# Patient Record
Sex: Female | Born: 1940 | Race: White | Hispanic: No | Smoking: Never smoker
Health system: Southern US, Community
[De-identification: ages and names within clinical notes are randomized; demographics above are authoritative.]

## PROBLEM LIST (undated history)

## (undated) DIAGNOSIS — F32A Depression, unspecified: Secondary | ICD-10-CM

## (undated) DIAGNOSIS — F419 Anxiety disorder, unspecified: Secondary | ICD-10-CM

## (undated) DIAGNOSIS — E7800 Pure hypercholesterolemia, unspecified: Secondary | ICD-10-CM

## (undated) HISTORY — PX: ABDOMINAL HYSTERECTOMY: SHX81

## (undated) HISTORY — PX: JOINT REPLACEMENT: SHX530

---

## 2021-11-13 ENCOUNTER — Emergency Department
Admission: EM | Admit: 2021-11-13 | Discharge: 2021-11-13 | Disposition: A | Discharge status: Home / Self Care | Payer: Medicare (Managed Care) | Attending: Emergency Medicine | Admitting: Emergency Medicine

## 2021-11-13 ENCOUNTER — Emergency Department: Payer: Medicare (Managed Care)

## 2021-11-13 ENCOUNTER — Other Ambulatory Visit: Payer: Self-pay

## 2021-11-13 ENCOUNTER — Encounter: Payer: Self-pay | Admitting: Intensive Care

## 2021-11-13 DIAGNOSIS — R197 Diarrhea, unspecified: Secondary | ICD-10-CM | POA: Insufficient documentation

## 2021-11-13 DIAGNOSIS — R55 Syncope and collapse: Secondary | ICD-10-CM | POA: Diagnosis not present

## 2021-11-13 DIAGNOSIS — R8289 Other abnormal findings on cytological and histological examination of urine: Secondary | ICD-10-CM | POA: Insufficient documentation

## 2021-11-13 DIAGNOSIS — R1030 Lower abdominal pain, unspecified: Secondary | ICD-10-CM | POA: Insufficient documentation

## 2021-11-13 DIAGNOSIS — R112 Nausea with vomiting, unspecified: Secondary | ICD-10-CM | POA: Diagnosis not present

## 2021-11-13 DIAGNOSIS — E860 Dehydration: Secondary | ICD-10-CM | POA: Insufficient documentation

## 2021-11-13 HISTORY — DX: Pure hypercholesterolemia, unspecified: E78.00

## 2021-11-13 HISTORY — DX: Anxiety disorder, unspecified: F41.9

## 2021-11-13 HISTORY — DX: Depression, unspecified: F32.A

## 2021-11-13 LAB — BASIC METABOLIC PANEL
Anion gap: 8 (ref 5–15)
BUN: 11 mg/dL (ref 8–23)
CO2: 29 mmol/L (ref 22–32)
Calcium: 9.4 mg/dL (ref 8.9–10.3)
Chloride: 104 mmol/L (ref 98–111)
Creatinine, Ser: 0.65 mg/dL (ref 0.44–1.00)
GFR, Estimated: >60 mL/min (ref >60)
Glucose, Bld: 104 mg/dL — ABNORMAL HIGH (ref 70–99)
Potassium: 4.2 mmol/L (ref 3.5–5.1)
Sodium: 141 mmol/L (ref 135–145)

## 2021-11-13 LAB — URINALYSIS, ROUTINE W REFLEX MICROSCOPIC
Bacteria, UA: NONE SEEN
Bilirubin Urine: NEGATIVE
Glucose, UA: NEGATIVE mg/dL
Ketones, ur: NEGATIVE mg/dL
Nitrite: NEGATIVE
Protein, ur: NEGATIVE mg/dL
Specific Gravity, Urine: 1.005 (ref 1.005–1.030)
pH: 7 (ref 5.0–8.0)

## 2021-11-13 LAB — CBC
HCT: 40.6 % (ref 36.0–46.0)
Hemoglobin: 13.1 g/dL (ref 12.0–15.0)
MCH: 31.8 pg (ref 26.0–34.0)
MCHC: 32.3 g/dL (ref 30.0–36.0)
MCV: 98.5 fL (ref 80.0–100.0)
Platelets: 202 10*3/uL (ref 150–400)
RBC: 4.12 MIL/uL (ref 3.87–5.11)
RDW: 13.1 % (ref 11.5–15.5)
WBC: 5.3 10*3/uL (ref 4.0–10.5)
nRBC: 0 % (ref 0.0–0.2)

## 2021-11-13 MED ORDER — IOHEXOL 300 MG/ML  SOLN
100.0000 mL | Freq: Once | INTRAMUSCULAR | Status: AC | PRN
  Administered 2021-11-13: 100 mL via INTRAVENOUS

## 2021-11-13 MED ORDER — DICYCLOMINE HCL 10 MG PO CAPS: 10.0000 mg | ORAL_CAPSULE | Freq: Three times a day (TID) | ORAL | 0 refills | Status: DC | PRN

## 2021-11-13 MED ORDER — ONDANSETRON 4 MG PO TBDP: 4.0000 mg | ORAL_TABLET | Freq: Three times a day (TID) | ORAL | 0 refills | Status: DC | PRN

## 2021-11-13 MED ORDER — CEPHALEXIN 500 MG PO CAPS: 500.0000 mg | ORAL_CAPSULE | Freq: Two times a day (BID) | ORAL | 0 refills | Status: AC

## 2021-11-13 MED ORDER — SODIUM CHLORIDE 0.9 % IV BOLUS
500.0000 mL | Freq: Once | INTRAVENOUS | Status: AC
  Administered 2021-11-13: 500 mL via INTRAVENOUS

## 2021-11-13 NOTE — ED Notes (Signed)
Pt verbalized understanding of discharge instructions, prescriptions, and follow-up care instructions. Pt advised if symptoms worsen to return to ED.  

## 2021-11-13 NOTE — ED Provider Notes (Signed)
[facility] Provider Note    Event Date/Time   First MD Initiated Contact with Patient 11/13/21 1702     (approximate)   History   Loss of Consciousness (/) and Diarrhea   HPI  Tammy Kelly is a 81 y.o. female with a history of hypercholesterolemia and depression who presents with diarrhea for the last 2 to 3 weeks.  The patient came here at that time from [place] for an extended trip and states that the diarrhea started just before she left [place].  She denies any other recent travel or change in her food.  The diarrhea is nonbloody.  It is associated with intermittent lower abdominal pain that is not present currently.  She has had occasional nausea and vomiting.  The patient states he feels dehydrated and 2 days ago had an episode where she was ironing shirts, felt lightheaded, and then briefly lost consciousness.  She got up but then had a second syncopal episode immediately after.  She had no chest pain or difficulty breathing.  Since then she has not had any additional syncope or near syncope.   Physical Exam   Triage Vital Signs: ED Triage Vitals [11/13/21 1550]  Enc Vitals Group     BP (!) 153/67     Pulse Rate (!) 57     Resp 16     Temp 98.2 F (36.8 C)     Temp Source Oral     SpO2 100 %     Weight 165 lb (74.8 kg)     Height 5' (1.524 m)     Head Circumference      Peak Flow      Pain Score 0     Pain Loc      Pain Edu?      Excl. in GC?     Most recent vital signs: Vitals:   11/13/21 1736 11/13/21 1955  BP: 132/78 117/68  Pulse: (!) 58 (!) 59  Resp: 14 15  Temp:  98.3 F (36.8 C)  SpO2: 100% 100%     General: Alert and oriented, well-appearing. CV:  Good peripheral perfusion.  Resp:  Normal effort.  Abd:  No distention.  Soft with no focal tenderness. Other:  Slightly dry mucous membranes.   ED Results / Procedures / Treatments   Labs (all labs ordered are listed, but only abnormal results are  displayed) Labs Reviewed  BASIC METABOLIC PANEL - Abnormal; Notable for the following components:      Result Value   Glucose, Bld 104 (*)    All other components within normal limits  URINALYSIS, ROUTINE W REFLEX MICROSCOPIC - Abnormal; Notable for the following components:   Color, Urine STRAW (*)    APPearance CLEAR (*)    Hgb urine dipstick SMALL (*)    Leukocytes,Ua MODERATE (*)    Non Squamous Epithelial PRESENT (*)    All other components within normal limits  GASTROINTESTINAL PANEL BY PCR, STOOL (REPLACES STOOL CULTURE)  CBC     EKG  ED ECG REPORT I, Dionne Bucy, the attending physician, personally viewed and interpreted this ECG.  Date: 11/13/2021 EKG Time: 1558 Rate: 60 Rhythm: normal sinus rhythm QRS Axis: normal Intervals: LAFB ST/T Wave abnormalities: normal Narrative Interpretation: no evidence of acute ischemia    RADIOLOGY  CT abdomen/pelvis: I independently viewed and interpreted the images; there are no dilated bowel loops, free air, or significant free fluid.  Radiology report confirms no acute findings  PROCEDURES:  Critical  Care performed: No  Procedures   MEDICATIONS ORDERED IN ED: Medications  sodium chloride 0.9 % bolus 500 mL (0 mLs Intravenous Stopped 11/13/21 1940)  iohexol (OMNIPAQUE) 300 MG/ML solution 100 mL (100 mLs Intravenous Contrast Given 11/13/21 1854)     IMPRESSION / MDM / ASSESSMENT AND PLAN / ED COURSE  I reviewed the triage vital signs and the nursing notes.  81 year old female with PMH as noted above presents with subacute diarrhea for the last few weeks associated with some intermittent abdominal pain and nausea.  I attempted to review the past medical records, however the patient lives in [place] and has no prior records here.  On exam the patient is overall well-appearing.  Her vital signs are normal.  The abdomen is soft and nontender.  Mucous membranes are slightly dry.  Differential diagnosis  includes, but is not limited to, gastroenteritis, foodborne illness, food intolerance, IBD, IBS or other functional etiology, colitis, diverticulitis.  The time course and description of the symptoms is not consistent with C. difficile.  I suspect the syncope was likely related to dehydration/hypovolemia.  There is no evidence of primary cardiac etiology.  Initial lab work-up reveals significant WBCs on the urinalysis but no leukocytosis and normal electrolytes and creatinine.  Clinically the patient appears mildly dehydrated.  Patient's presentation is most consistent with acute presentation with potential threat to life or bodily function.  We will give a fluid bolus, obtain a CT abdomen jaw diverticulitis or colitis, GI panel if the patient is able to provide stool, and reassess.  ----------------------------------------- 7:59 PM on 11/13/2021 -----------------------------------------  CT is negative for acute findings.  On reassessment the patient appears comfortable.  She has not had a bowel movement in the ED so we will not be sending a GI panel.  Urinalysis shows findings concerning for possible UTI.  The patient has no significant urinary symptoms but based on shared decision making I will prescribe an empiric antibiotic to treat for possible UTI.  At this time, the patient feels comfortable to go home.  She is stable for discharge at this time.  I counseled her on the results of the work-up.  I will provide Zofran and Bentyl for symptomatic treatment.  She will follow-up with her regular doctor when she returns to [place] early next month.  Return precautions given, and she expresses understanding.   FINAL CLINICAL IMPRESSION(S) / ED DIAGNOSES   Final diagnoses:  Diarrhea, unspecified type     Rx / DC Orders   ED Discharge Orders          Ordered    ondansetron (ZOFRAN-ODT) 4 MG disintegrating tablet  Every 8 hours PRN        11/13/21 1957    dicyclomine (BENTYL) 10 MG  capsule  Every 8 hours PRN        11/13/21 1957    cephALEXin (KEFLEX) 500 MG capsule  2 times daily        11/13/21 2002             Note:  This document was prepared using Dragon voice recognition software and may include unintentional dictation errors.    Dionne Bucy, MD 11/13/21 2002

## 2021-11-13 NOTE — ED Notes (Signed)
Pt denies symptoms at this moment but states that she has been nauseated with 6+ episodes of diarrhea since waking this morning. Denies pain. A/O x 4. Visiting from [facility] for family events; son at bedside assists with history as needed but pt is able to communicate without difficulty.

## 2021-11-13 NOTE — ED Triage Notes (Signed)
Patient reports diarrhea X1 month that has worsened. Episodes of vomiting on Tuesday. States she had LOC episode for about a minute on Wednesday 11/11/21 witnessed by son.

## 2021-11-13 NOTE — Discharge Instructions (Signed)
Follow-up with your regular doctor when you return to [place].  You may take the Zofran as needed for nausea and the Bentyl as needed for abdominal cramping.  You may take over-the-counter Imodium if needed for more frequent diarrhea.  Return to the ER for new, worsening, or persistent severe diarrhea, blood in the stool, abdominal pain, fever, persistent vomiting, or any other new or worsening symptoms that concern you.

## 2022-08-25 IMAGING — CT CT ABD-PELV W/ CM
2 of 5 series · 16 of 46 positions shown, 18 images · IV contrast (APPLIED)
Comparison: None Available.

CLINICAL DATA: Worsening diarrhea for 1 month, vomiting

EXAM:
CT ABDOMEN AND PELVIS WITH CONTRAST
TECHNIQUE: Multidetector CT imaging of the abdomen and pelvis was performed
using the standard protocol following bolus administration of
intravenous contrast.

[Series 2: routine abd/pel with · axial · 0.98mm/px · z∈[-878,-493]mm · 13 of 87 slices shown, 15 images]
[im 5/87  soft-tissue]
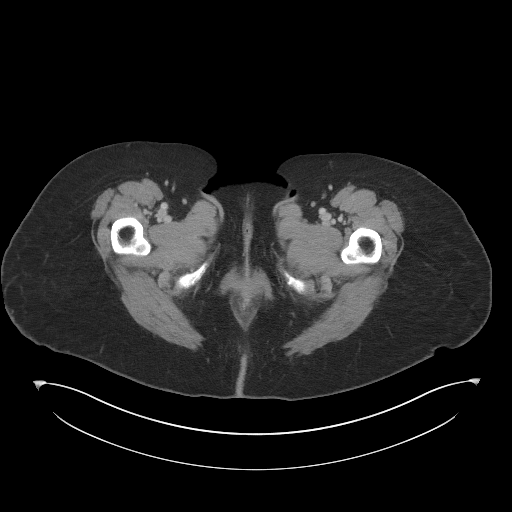
[im 5/87  bone]
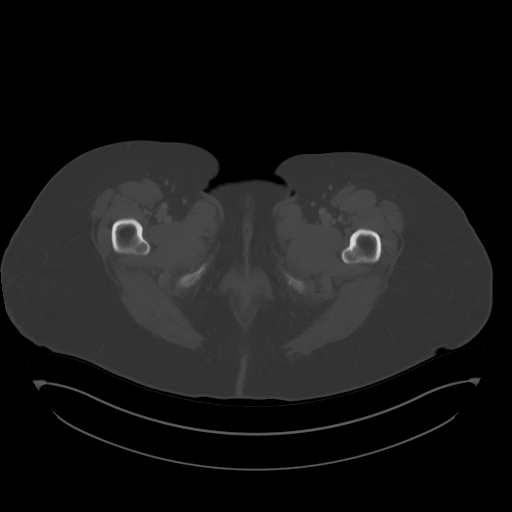
[im 10/87  soft-tissue]
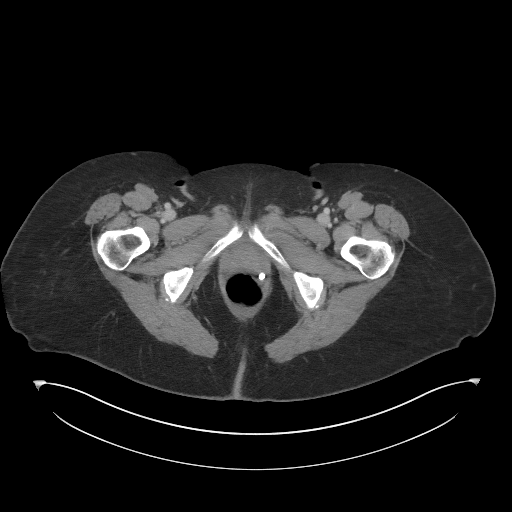
[im 20/87  soft-tissue]
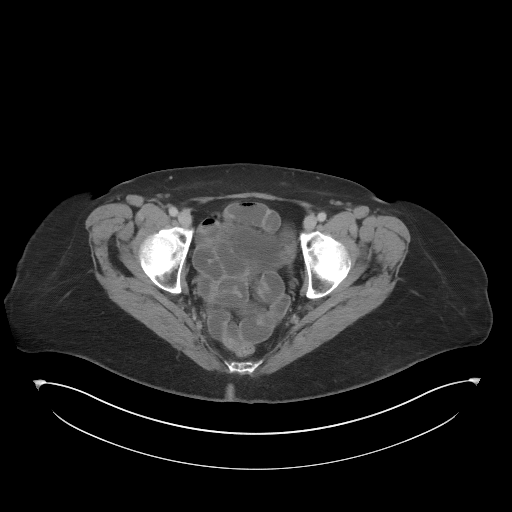
[im 24/87  soft-tissue]
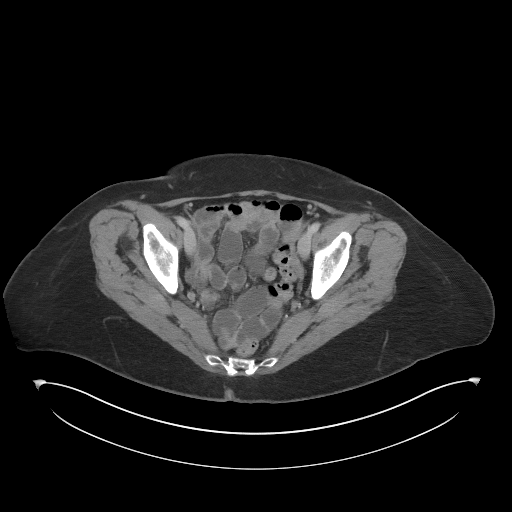
[im 29/87  soft-tissue]
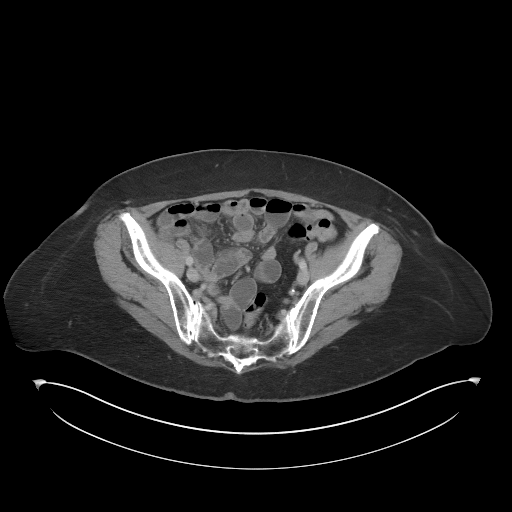
[im 39/87  soft-tissue]
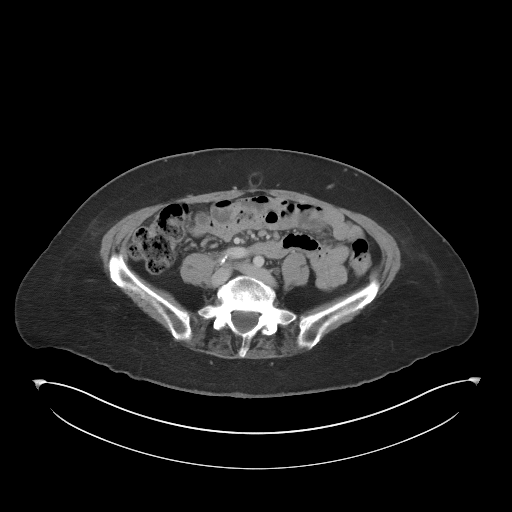
[im 44/87  soft-tissue]
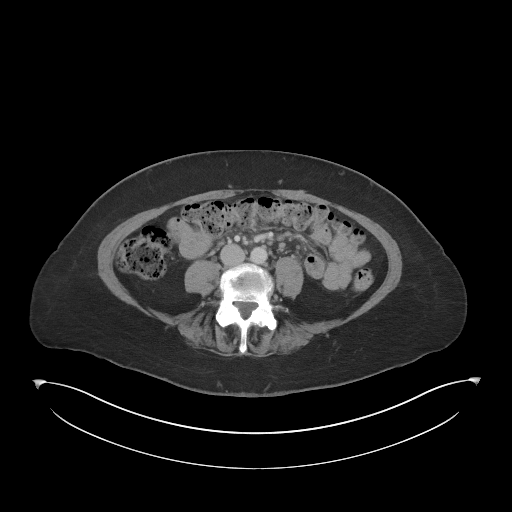
[im 48/87  soft-tissue]
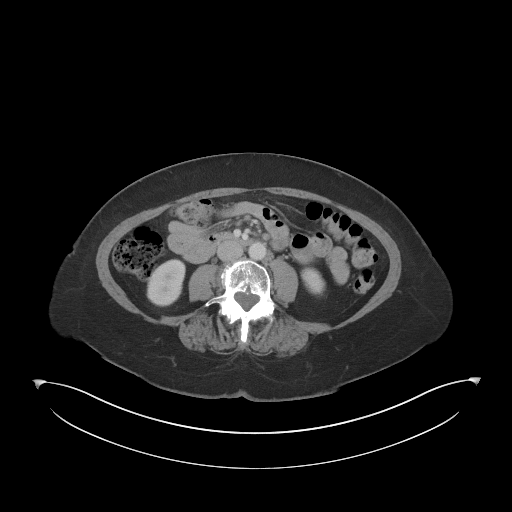
[im 58/87  soft-tissue]
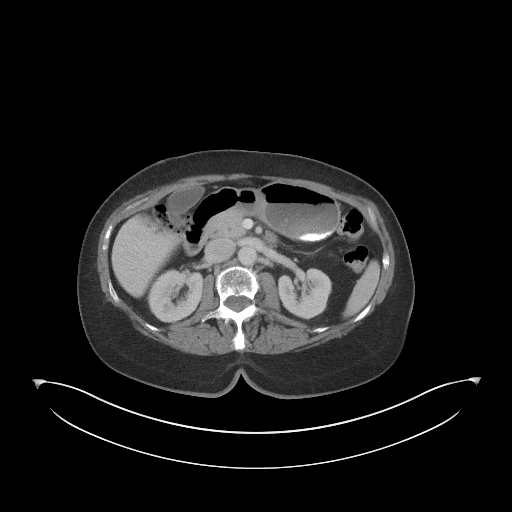
[im 58/87  bone]
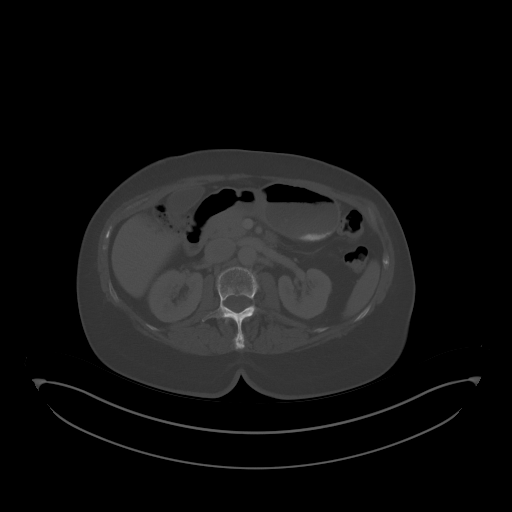
[im 63/87  soft-tissue]
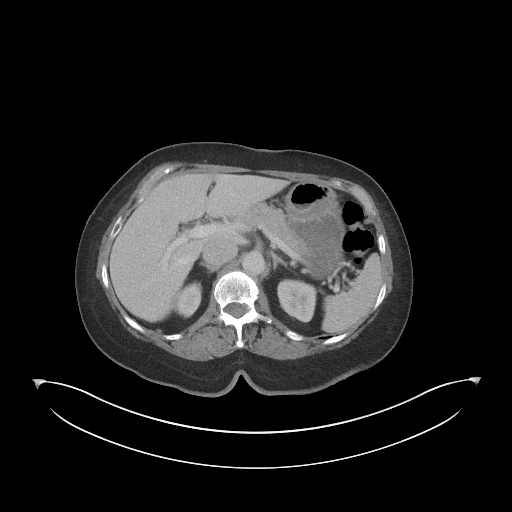
[im 67/87  soft-tissue]
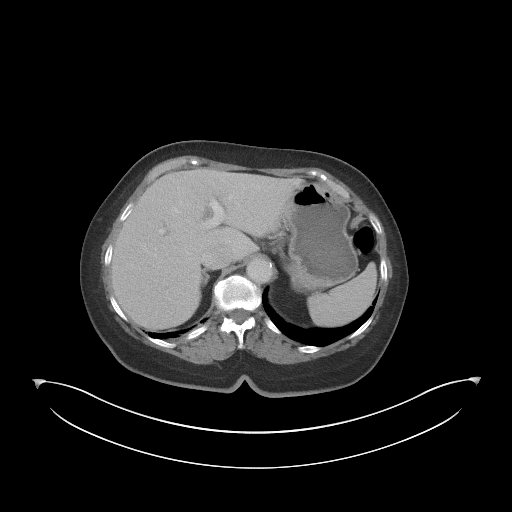
[im 77/87  soft-tissue]
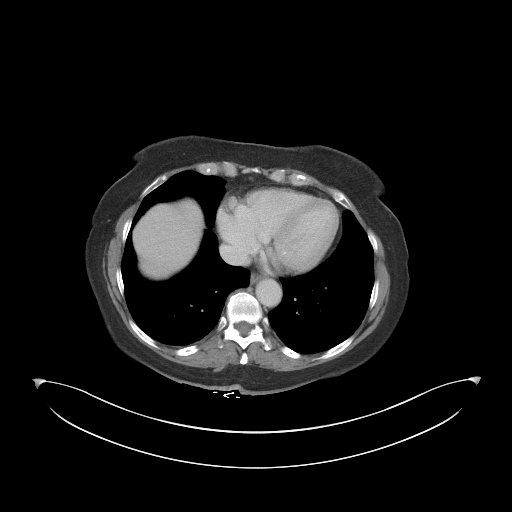
[im 82/87  soft-tissue]
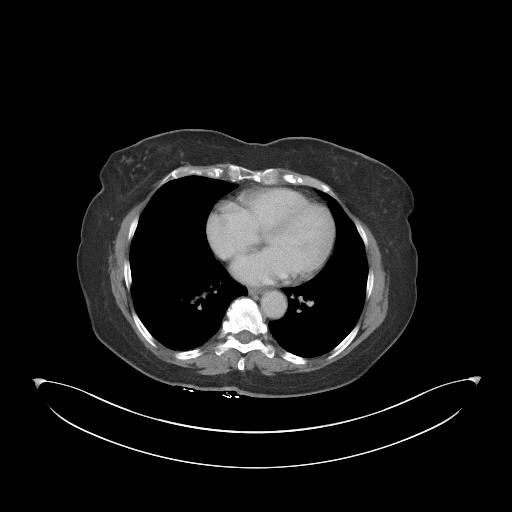

[Series 5: coronal st · coronal · 0.85mm/px · 3 of 78 slices shown]
[im 26/78  soft-tissue]
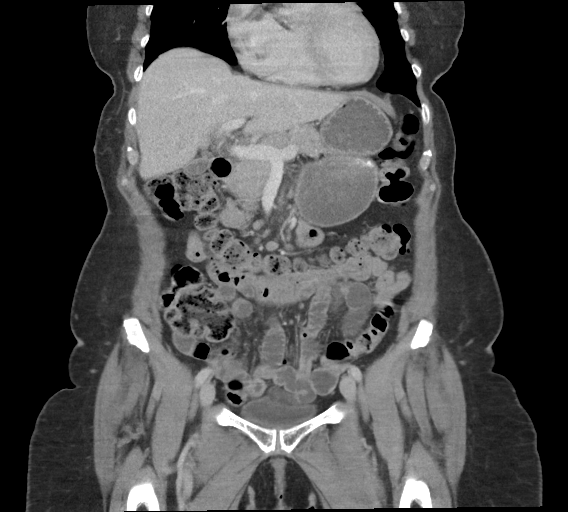
[im 35/78  soft-tissue]
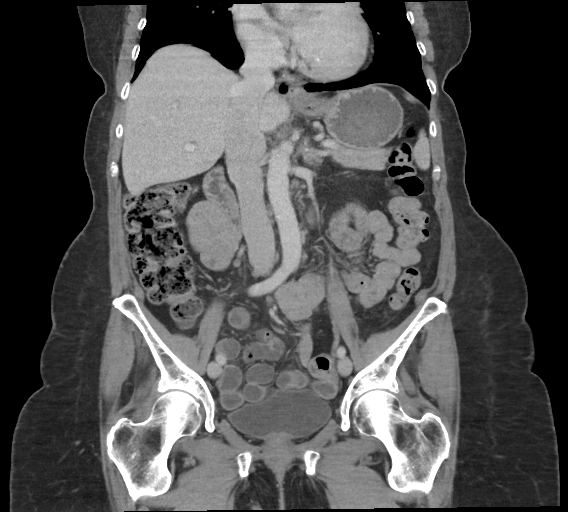
[im 43/78  soft-tissue]
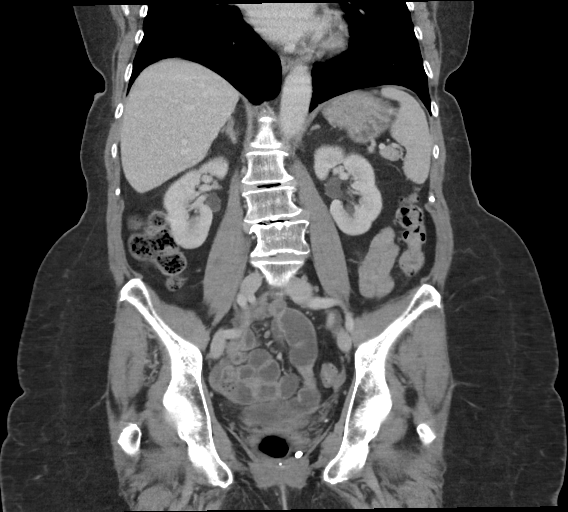

[16 of 46 positions shown; findings below may reference images not displayed]

RADIATION DOSE REDUCTION: This exam was performed according to the
departmental dose-optimization program which includes automated
exposure control, adjustment of the mA and/or kV according to
patient size and/or use of iterative reconstruction technique.

CONTRAST:  100mL OMNIPAQUE IOHEXOL 300 MG/ML  SOLN
FINDINGS: Lower chest: No acute pleural or parenchymal lung disease.

Hepatobiliary: No focal liver abnormality is seen. No gallstones,
gallbladder wall thickening, or biliary dilatation.

Pancreas: Unremarkable. No pancreatic ductal dilatation or
surrounding inflammatory changes.

Spleen: Normal in size without focal abnormality.

Adrenals/Urinary Tract: Adrenal glands are unremarkable. Kidneys are
normal, without renal calculi, focal lesion, or hydronephrosis.
Bladder is unremarkable.

Stomach/Bowel: No bowel obstruction or ileus. The appendix, if still
present, is not well visualized. No inflammatory changes in the
right lower quadrant to suggest appendicitis. No bowel wall
thickening.

Vascular/Lymphatic: Aortic atherosclerosis. No enlarged abdominal or
pelvic lymph nodes.

Reproductive: Status post hysterectomy. No adnexal masses.

Other: No free fluid or free intraperitoneal gas. There is a small
fat containing umbilical hernia. No bowel herniation.

Musculoskeletal: No acute or destructive bony lesions. Reconstructed
images demonstrate no additional findings.
IMPRESSION: 1. No acute intra-abdominal or intrapelvic process.
2.  Aortic Atherosclerosis ([id number].U).

## 2022-11-07 ENCOUNTER — Other Ambulatory Visit: Payer: Self-pay

## 2022-11-07 ENCOUNTER — Emergency Department (HOSPITAL_BASED_OUTPATIENT_CLINIC_OR_DEPARTMENT_OTHER): Payer: Medicare Other

## 2022-11-07 ENCOUNTER — Encounter (HOSPITAL_BASED_OUTPATIENT_CLINIC_OR_DEPARTMENT_OTHER): Payer: Self-pay

## 2022-11-07 ENCOUNTER — Emergency Department (HOSPITAL_BASED_OUTPATIENT_CLINIC_OR_DEPARTMENT_OTHER)
Admission: EM | Admit: 2022-11-07 | Discharge: 2022-11-07 | Disposition: A | Discharge status: Home / Self Care | Payer: Medicare Other | Attending: Emergency Medicine | Admitting: Emergency Medicine

## 2022-11-07 DIAGNOSIS — Z043 Encounter for examination and observation following other accident: Secondary | ICD-10-CM | POA: Diagnosis not present

## 2022-11-07 DIAGNOSIS — W01198A Fall on same level from slipping, tripping and stumbling with subsequent striking against other object, initial encounter: Secondary | ICD-10-CM | POA: Insufficient documentation

## 2022-11-07 DIAGNOSIS — S0993XA Unspecified injury of face, initial encounter: Secondary | ICD-10-CM | POA: Diagnosis not present

## 2022-11-07 DIAGNOSIS — S0093XA Contusion of unspecified part of head, initial encounter: Secondary | ICD-10-CM | POA: Diagnosis not present

## 2022-11-07 DIAGNOSIS — S0083XA Contusion of other part of head, initial encounter: Secondary | ICD-10-CM | POA: Insufficient documentation

## 2022-11-07 DIAGNOSIS — S0081XA Abrasion of other part of head, initial encounter: Secondary | ICD-10-CM | POA: Insufficient documentation

## 2022-11-07 DIAGNOSIS — R9431 Abnormal electrocardiogram [ECG] [EKG]: Secondary | ICD-10-CM | POA: Diagnosis not present

## 2022-11-07 DIAGNOSIS — W19XXXA Unspecified fall, initial encounter: Secondary | ICD-10-CM

## 2022-11-07 LAB — APTT: aPTT: 32 seconds (ref 24–36)

## 2022-11-07 LAB — CBC
HCT: 39.6 % (ref 36.0–46.0)
Hemoglobin: 12.9 g/dL (ref 12.0–15.0)
MCH: 31.8 pg (ref 26.0–34.0)
MCHC: 32.6 g/dL (ref 30.0–36.0)
MCV: 97.5 fL (ref 80.0–100.0)
Platelets: 213 10*3/uL (ref 150–400)
RBC: 4.06 MIL/uL (ref 3.87–5.11)
RDW: 13.2 % (ref 11.5–15.5)
WBC: 6.7 10*3/uL (ref 4.0–10.5)
nRBC: 0 % (ref 0.0–0.2)

## 2022-11-07 LAB — COMPREHENSIVE METABOLIC PANEL
ALT: 12 U/L (ref 0–44)
AST: 24 U/L (ref 15–41)
Albumin: 4.4 g/dL (ref 3.5–5.0)
Alkaline Phosphatase: 69 U/L (ref 38–126)
Anion gap: 7 (ref 5–15)
BUN: 17 mg/dL (ref 8–23)
CO2: 30 mmol/L (ref 22–32)
Calcium: 10.1 mg/dL (ref 8.9–10.3)
Chloride: 106 mmol/L (ref 98–111)
Creatinine, Ser: 0.73 mg/dL (ref 0.44–1.00)
GFR, Estimated: >60 mL/min (ref >60)
Glucose, Bld: 88 mg/dL (ref 70–99)
Potassium: 4.9 mmol/L (ref 3.5–5.1)
Sodium: 143 mmol/L (ref 135–145)
Total Bilirubin: 0.4 mg/dL (ref 0.3–1.2)
Total Protein: 6.6 g/dL (ref 6.5–8.1)

## 2022-11-07 LAB — PROTIME-INR
INR: 1 (ref 0.8–1.2)
Prothrombin Time: 13.1 seconds (ref 11.4–15.2)

## 2022-11-07 NOTE — ED Provider Notes (Signed)
[place] [facility]GENC[facility] DEPARTMENT AT [facility] Provider Note   CSN: [id number] Arrival date & time: 11/07/22  1015     History  Chief Complaint  Patient presents with   Marletta Lor    Tammy Kelly is a 82 y.o. female who presents to ED after fall this morning. Patient tripped and fell, landing on her face. Complaining of facial pain and worried about broken nose. Nose with minimal bleeding.  Denies change in vision, loss of consciousness, blood thinners, nausea/vomiting, seizures, chest pain, SOB, numbness, paresthesias, weakness.   Fall       Home Medications Prior to Admission medications   Medication Sig Start Date End Date Taking? Authorizing Provider  dicyclomine (BENTYL) 10 MG capsule Take 1 capsule (10 mg total) by mouth every 8 (eight) hours as needed for spasms (abdominal cramping). 11/13/21   Dionne Bucy, MD  ondansetron (ZOFRAN-ODT) 4 MG disintegrating tablet Take 1 tablet (4 mg total) by mouth every 8 (eight) hours as needed for nausea or vomiting. 11/13/21   Dionne Bucy, MD      Allergies    Patient has no known allergies.    Review of Systems   Review of Systems  Skin:        Forehead hematoma; nose abrasion    Physical Exam Updated Vital Signs BP (!) 147/67 (BP Location: Right Arm)   Pulse (!) 58   Temp 98.4 F (36.9 C) (Oral)   Resp 11   Ht 5' (1.524 m)   Wt 74.8 kg   SpO2 98%   BMI 32.22 kg/m  Physical Exam Vitals and nursing note reviewed.  Constitutional:      General: She is not in acute distress.    Appearance: She is not ill-appearing or toxic-appearing.  HENT:     Head: Normocephalic.     Comments: Abrasion of right forehead and on top of nose. Small hematoma of right forehead. Nose is symmetric. No laceration that requires suturing. No foreign body, swelling, or purulence noted.    Right Ear: Tympanic membrane, ear canal and external ear normal.     Left Ear: Tympanic membrane, ear canal and external ear normal.      Mouth/Throat:     Mouth: Mucous membranes are moist.     Pharynx: No oropharyngeal exudate or posterior oropharyngeal erythema.  Eyes:     General: No scleral icterus.       Right eye: No discharge.        Left eye: No discharge.     Conjunctiva/sclera: Conjunctivae normal.  Cardiovascular:     Rate and Rhythm: Normal rate.     Pulses: Normal pulses.     Heart sounds: Normal heart sounds. No murmur heard. Pulmonary:     Effort: Pulmonary effort is normal. No respiratory distress.     Breath sounds: Normal breath sounds. No wheezing, rhonchi or rales.  Abdominal:     General: Abdomen is flat. Bowel sounds are normal.     Palpations: Abdomen is soft.     Tenderness: There is no abdominal tenderness.  Musculoskeletal:     Cervical back: Normal range of motion and neck supple. No tenderness.     Right lower leg: No edema.     Left lower leg: No edema.     Comments: No tenderness to palpation of head, spine, shoulders, hips, knees, ankles. No swelling or deformity. Patient with normal active ROM of ankles, knees, hip, elbow, shoulders, neck.  Skin:    General: Skin is warm  and dry.     Capillary Refill: Capillary refill takes less than 2 seconds.     Findings: No rash.  Neurological:     General: No focal deficit present.     Mental Status: She is alert and oriented to person, place, and time. Mental status is at baseline.     Cranial Nerves: No cranial nerve deficit.     Sensory: No sensory deficit.     Motor: No weakness.     Comments: GCS 15. Speech is goal oriented. No deficits appreciated to CN III-XII; symmetric eyebrow raise, no facial drooping, tongue midline. Patient has equal grip strength bilaterally with 5/5 strength against resistance in all major muscle groups bilaterally. Sensation to light touch intact. Patient moves extremities without ataxia. Patient ambulatory with steady gait.   Psychiatric:        Mood and Affect: Mood normal.        Behavior: Behavior  normal.     ED Results / Procedures / Treatments   Labs (all labs ordered are listed, but only abnormal results are displayed) Labs Reviewed  CBC  COMPREHENSIVE METABOLIC PANEL  APTT  PROTIME-INR    EKG None  Radiology CT Maxillofacial WO CM  Result Date: 11/07/2022 CLINICAL DATA:  Fall getting out of car today. EXAM: CT MAXILLOFACIAL WITHOUT CONTRAST TECHNIQUE: Multidetector CT imaging of the maxillofacial structures was performed. Multiplanar CT image reconstructions were also generated. RADIATION DOSE REDUCTION: This exam was performed according to the departmental dose-optimization program which includes automated exposure control, adjustment of the mA and/or kV according to patient size and/or use of iterative reconstruction technique. COMPARISON:  None Available. FINDINGS: Osseous: Acute fractures are. The mandible is intact and located. Facial bones are within normal limits. Orbits: The globes and orbits are within normal limits. Sinuses: The paranasal sinuses and mastoid air cells are clear. Soft tissues: Right supraorbital scalp soft tissue swelling and hematoma is present without underlying fracture. No other focal facial injury is present. Limited intracranial: Within normal limits. IMPRESSION: Right supraorbital scalp soft tissue swelling and hematoma without underlying fracture. Electronically Signed   By: Marin Roberts M.D.   On: 11/07/2022 13:44   CT Cervical Spine Wo Contrast  Result Date: 11/07/2022 CLINICAL DATA:  Fall getting out of the car today. EXAM: CT CERVICAL SPINE WITHOUT CONTRAST TECHNIQUE: Multidetector CT imaging of the cervical spine was performed without intravenous contrast. Multiplanar CT image reconstructions were also generated. RADIATION DOSE REDUCTION: This exam was performed according to the departmental dose-optimization program which includes automated exposure control, adjustment of the mA and/or kV according to patient size and/or use of  iterative reconstruction technique. COMPARISON:  None Available. FINDINGS: Alignment: No significant listhesis is present. Cervical lordosis is preserved. Skull base and vertebrae: The craniocervical junction is normal. Vertebral body heights are normal. No acute or healing fractures are present. Soft tissues and spinal canal: No prevertebral fluid or swelling. No visible canal hematoma. Disc levels: Uncovertebral and facet hypertrophy lead to mild right foraminal narrowing C5-6 greater than C 4 5 left greater than right foraminal stenosis is present at C6-7. Facet hypertrophy is present bilaterally at C7-T1. Upper chest: The lung apices are clear. The thoracic inlet is within normal limits. IMPRESSION: 1. No acute fracture or traumatic subluxation. 2. Multilevel degenerative changes of the cervical spine as described. Electronically Signed   By: Marin Roberts M.D.   On: 11/07/2022 13:39   CT Head Wo Contrast  Result Date: 11/07/2022 CLINICAL DATA:  Fall while getting  of car.  Trauma. EXAM: CT HEAD WITHOUT CONTRAST TECHNIQUE: Contiguous axial images were obtained from the base of the skull through the vertex without intravenous contrast. RADIATION DOSE REDUCTION: This exam was performed according to the departmental dose-optimization program which includes automated exposure control, adjustment of the mA and/or kV according to patient size and/or use of iterative reconstruction technique. COMPARISON:  None Available. FINDINGS: Brain: Mild atrophy and white matter changes are present. No acute infarct, hemorrhage, or mass lesion is present. The ventricles are of normal size. No significant extraaxial fluid collection is present. The brainstem and cerebellum are within normal limits. Insert normal cerebellum Midline structures are within normal limits. Vascular: Faint atherosclerotic calcifications are present within the cavernous internal carotid arteries bilaterally. No hyperdense vessel is present.  Skull: Calvarium is intact. Right supraorbital scalp soft tissue swelling and hematoma is present. No underlying fracture is present. No radiopaque foreign body present. No other acute extracranial soft tissue abnormalities are present. 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 Calvarium is otherwise within normal limits. Sinuses/Orbits: The paranasal sinuses and mastoid air cells are clear. The globes and orbits are within normal limits. IMPRESSION: 1. Right supraorbital scalp soft tissue swelling and hematoma without underlying fracture. 2. No acute intracranial abnormality. 3. Mild atrophy and white matter disease likely reflects the sequela of chronic microvascular ischemia. Electronically Signed   By: Marin Roberts M.D.   On: 11/07/2022 13:36    Procedures Procedures    Medications Ordered in ED Medications - No data to display  ED Course/ Medical Decision Making/ A&P                             Medical Decision Making Amount and/or Complexity of Data Reviewed Labs: ordered. Radiology: ordered.   This patient presents to the ED after a fall, this involves an extensive number of treatment options, and is a complaint that carries with it a high risk of complications and morbidity.  The differential diagnosis includes  intracranial hemorrhage, subdural/epidural hematoma, vertebral fracture, spinal cord injury, muscle strain, skull fracture, fracture.   Co morbidities that complicate the patient evaluation  none    Lab Tests:  I Ordered, and personally interpreted labs.  The pertinent results include:   -APTT: within normal limits -PTINR: within normal limits -CBC: No concern for anemia or leukocytosis -CMP: no concern for electrolyte abnormality; no concern for kidney/liver damage   Imaging Studies ordered:  I ordered imaging studies including  -CT maxillofacial: Right supraorbital scalp soft tissue swelling and hematoma without underlying fracture. -CT neck:  No acute fracture or traumatic subluxation. Multilevel degenerative changes of the cervical spine as described. - CT head: Right supraorbital scalp soft tissue swelling and hematoma without underlying fracture. No acute intracranial abnormality. Mild atrophy and white matter disease likely reflects the sequela of chronic microvascular ischemia. I independently visualized and interpreted imaging  I agree with the radiologist interpretation   Cardiac Monitoring: / EKG:  The patient was maintained on a cardiac monitor.  I personally viewed and interpreted the cardiac monitored which showed an underlying rhythm of: sinus rhythm without acute ST changes or arrhythmias     Problem List / ED Course / Critical interventions / Medication management  Patient presented for Fall. Patient with stable vitals and does not appear to be in distress. Patient had an unremarkable physical exam. Unremarkable neuro exam. No pain to palpation of joints. Normal strength and ROM of all  extremities. Patent ambulatory to bathroom multiple times in ED without gait abnormalities or instabilities. CT head/neck/maxillofacial without concern for intracranial bleeding or fractures. Blood labs reassuring. Patient states that she feels great and is ready to go home. Patient afebrile with stable vitals. Educated patient that signs of concussion can present in the next few days, and provided return precautions. I have reviewed the patients home medicines and have made adjustments as needed Patient was given return precautions. Patient stable for discharge at this time. Patient verbalized understanding of plan.   DDx: These are considered less likely due to history of present illness and physical exam findings -Intracranial hemorrhage, subdural/epidural hematoma: CT without concern -Vertebral fracture: CT without concern -Spinal cord injury: CT without concern; no neurodeficits -Skull fracture: No postauricular ecchymosis, no  periorbital ecchymosis, no hemotympanum -Fracture: No step-offs/crepitus/abnormalities palpated in head, neck, chest, upper extremities, lower extremities, pelvis; CT without concern  Risk Stratification Score:  Nexus C-spine: imaging obtained [place] Head CT: imaging obtained   Social Determinants of Health:  none              Final Clinical Impression(s) / ED Diagnoses Final diagnoses:  Fall, initial encounter  Abrasion, face w/o infection    Rx / DC Orders ED Discharge Orders     None         Dorthy Cooler, [place] 11/07/22 1434    Ernie Avena, MD 11/07/22 1953

## 2022-11-07 NOTE — ED Triage Notes (Signed)
Patient here POV from Home.  Endorses Falling today when she was stepping out of her car and stubbed her Toe. Fell onto the [employer]. Occurred 1 Hour ago. No LOC. No Anticoagulants.  Pain to Nose. Bruising to Same. Left Nare Bleeding.   NAD Noted during Triage. A&Ox4. GCS 15. BIB Wheelchair.

## 2022-11-07 NOTE — Discharge Instructions (Addendum)
It was a pleasure caring for you today. Imaging was without concern for intracranial bleeding or fractures. Blood labs were also reassuring. I am not seeing a primary care provider in your chart, so I have included information in this discharge paperwork to help you establish primary care.   Seek emergency care if experiencing any new or worsening symptoms.

## 2022-11-12 DIAGNOSIS — W19XXXA Unspecified fall, initial encounter: Secondary | ICD-10-CM | POA: Diagnosis not present

## 2022-11-12 DIAGNOSIS — E785 Hyperlipidemia, unspecified: Secondary | ICD-10-CM | POA: Diagnosis not present

## 2022-11-12 DIAGNOSIS — G4700 Insomnia, unspecified: Secondary | ICD-10-CM | POA: Diagnosis not present

## 2022-11-12 DIAGNOSIS — F3340 Major depressive disorder, recurrent, in remission, unspecified: Secondary | ICD-10-CM | POA: Diagnosis not present

## 2022-11-17 ENCOUNTER — Other Ambulatory Visit: Payer: Self-pay | Admitting: Internal Medicine

## 2022-11-17 DIAGNOSIS — M85859 Other specified disorders of bone density and structure, unspecified thigh: Secondary | ICD-10-CM

## 2022-12-08 DIAGNOSIS — K080 Exfoliation of teeth due to systemic causes: Secondary | ICD-10-CM | POA: Diagnosis not present

## 2022-12-28 ENCOUNTER — Other Ambulatory Visit (HOSPITAL_BASED_OUTPATIENT_CLINIC_OR_DEPARTMENT_OTHER): Payer: Self-pay

## 2022-12-28 ENCOUNTER — Encounter (HOSPITAL_BASED_OUTPATIENT_CLINIC_OR_DEPARTMENT_OTHER): Payer: Self-pay | Admitting: Emergency Medicine

## 2022-12-28 ENCOUNTER — Other Ambulatory Visit: Payer: Self-pay

## 2022-12-28 ENCOUNTER — Emergency Department (HOSPITAL_BASED_OUTPATIENT_CLINIC_OR_DEPARTMENT_OTHER)
Admission: EM | Admit: 2022-12-28 | Discharge: 2022-12-28 | Disposition: A | Discharge status: Home / Self Care | Payer: Medicare Other | Attending: Emergency Medicine | Admitting: Emergency Medicine

## 2022-12-28 ENCOUNTER — Emergency Department (HOSPITAL_BASED_OUTPATIENT_CLINIC_OR_DEPARTMENT_OTHER): Payer: Medicare Other | Admitting: Radiology

## 2022-12-28 DIAGNOSIS — Y92512 Supermarket, store or market as the place of occurrence of the external cause: Secondary | ICD-10-CM | POA: Insufficient documentation

## 2022-12-28 DIAGNOSIS — Z471 Aftercare following joint replacement surgery: Secondary | ICD-10-CM | POA: Diagnosis not present

## 2022-12-28 DIAGNOSIS — G8911 Acute pain due to trauma: Secondary | ICD-10-CM | POA: Diagnosis not present

## 2022-12-28 DIAGNOSIS — W1839XA Other fall on same level, initial encounter: Secondary | ICD-10-CM | POA: Diagnosis not present

## 2022-12-28 DIAGNOSIS — M25461 Effusion, right knee: Secondary | ICD-10-CM | POA: Diagnosis not present

## 2022-12-28 DIAGNOSIS — Z96651 Presence of right artificial knee joint: Secondary | ICD-10-CM | POA: Diagnosis not present

## 2022-12-28 DIAGNOSIS — M25561 Pain in right knee: Secondary | ICD-10-CM | POA: Diagnosis not present

## 2022-12-28 DIAGNOSIS — Z043 Encounter for examination and observation following other accident: Secondary | ICD-10-CM | POA: Diagnosis not present

## 2022-12-28 DIAGNOSIS — I444 Left anterior fascicular block: Secondary | ICD-10-CM | POA: Diagnosis not present

## 2022-12-28 MED ORDER — ACETAMINOPHEN 500 MG PO TABS
1000.0000 mg | ORAL_TABLET | Freq: Once | ORAL | Status: AC
  Administered 2022-12-28: 1000 mg via ORAL
  Filled 2022-12-28: qty 2

## 2022-12-28 NOTE — Discharge Instructions (Addendum)
You were seen today for knee pain.  Your x-ray did not show any evidence of fracture.  You should follow-up with your primary care provider and call the number above to follow-up with orthopedic surgery.  You can use the crutches to help with ambulation.  If you develop severe pain, inability to ambulate or any other new concerning symptoms you should return to the ED.

## 2022-12-28 NOTE — ED Triage Notes (Signed)
Pt arrives to ED with c/o fall that occurred yesterday. Pt denies head or neck injury. She notes she was getting into her car and her right knee gave out causing her to fall on her buttocks.

## 2022-12-28 NOTE — ED Provider Notes (Signed)
[place] EMERGENCY DEPARTMENT AT [facility] Provider Note   CSN: [id number] Arrival date & time: 12/28/22  0827     History  Chief Complaint  Patient presents with   Tammy Kelly    Tammy Kelly is a 82 y.o. female.   Fall  82 year old female history of anxiety, depression, hyperlipidemia presenting for right knee pain.  She states yesterday she was shopping at [employer] with a friend.  She went in to get into the passenger door of her car.  She felt her right knee buckle and she fell and landed on her bottom.  No dizziness, presyncope, syncope.  She did not hit her head or lose consciousness.  She had some mild pain yesterday, but continued to have pain today so she presents for evaluation.  She has not any additional trauma.  She is not on any anticoagulation or antiplatelets.  She took some Tylenol yesterday.  She is most concerned about heart failure what is going on with her knee.  It is buckled several times over the last few months.  She had knee replacement the right knee about 5 years ago in [place].  She lives in a independent living facility and has some help at home.  She is here today with her grandson.  She has no numbness or tingling.  No fevers or chills.  She has no other pain including headache, vomiting, neck pain, chest pain, back pain, abdominal pain.     Home Medications Prior to Admission medications   Medication Sig Start Date End Date Taking? Authorizing Provider  atorvastatin (LIPITOR) 10 MG tablet Take 10 mg by mouth daily.    [provider]  dicyclomine (BENTYL) 10 MG capsule Take 1 capsule (10 mg total) by mouth every 8 (eight) hours as needed for spasms (abdominal cramping). 11/13/21   Dionne Bucy, MD  mirtazapine (REMERON) 15 MG tablet Take 15 mg by mouth at bedtime.    [provider]  ondansetron (ZOFRAN-ODT) 4 MG disintegrating tablet Take 1 tablet (4 mg total) by mouth every 8 (eight) hours as needed for nausea or  vomiting. 11/13/21   Dionne Bucy, MD      Allergies    Patient has no known allergies.    Review of Systems   Review of Systems  Review of systems completed and notable as per HPI.  ROS otherwise negative.   Physical Exam Updated Vital Signs BP 133/69   Pulse 61   Temp 97.7 F (36.5 C) (Oral)   Resp 20   Ht 5' (1.524 m)   Wt 72.6 kg   SpO2 98%   BMI 31.25 kg/m  Physical Exam Constitutional:      General: She is not in acute distress.    Appearance: Normal appearance.  HENT:     Head: Normocephalic and atraumatic.  Eyes:     Extraocular Movements: Extraocular movements intact.     Pupils: Pupils are equal, round, and reactive to light.  Cardiovascular:     Rate and Rhythm: Normal rate and regular rhythm.     Pulses: Normal pulses.     Heart sounds: Normal heart sounds.  Pulmonary:     Effort: Pulmonary effort is normal. No respiratory distress.     Breath sounds: Normal breath sounds.  Abdominal:     General: Abdomen is flat. There is no distension.     Palpations: Abdomen is soft.     Tenderness: There is no abdominal tenderness.  Musculoskeletal:     Cervical  back: Normal range of motion. No rigidity or tenderness.     Right lower leg: No edema.     Left lower leg: No edema.     Comments: Mild tenderness to the medial aspect of the right knee.  She has no swelling or skin changes.  No signs of trauma.  Slightly limited knee flexion due to pain.  She has 2+ DP and PT pulse bilateral lower extremities.  She has normal sensation.  She has no tenderness or pain with range of motion of the ankle, foot, right hip.  She has no spinal tenderness.  Skin:    General: Skin is warm and dry.     Capillary Refill: Capillary refill takes less than 2 seconds.  Neurological:     General: No focal deficit present.     Mental Status: She is alert and oriented to person, place, and time. Mental status is at baseline.     ED Results / Procedures / Treatments   Labs (all  labs ordered are listed, but only abnormal results are displayed) Labs Reviewed - No data to display  EKG EKG Interpretation Date/Time:  Tuesday December 28 2022 08:43:40 EDT Ventricular Rate:  70 PR Interval:    QRS Duration:  104 QT Interval:  390 QTC Calculation: 421 R Axis:   -50  Text Interpretation: Normal sinus rhythm LAD, consider left anterior fascicular block Confirmed by Alona Bene [phone]) on 12/28/2022 9:07:08 AM  Radiology DG Knee 2 Views Right  Result Date: 12/28/2022 CLINICAL DATA:  History of total right knee arthroplasty. Fall with knee buckling last night. Knee has been giving out a lot recently. EXAM: RIGHT KNEE - 1-2 VIEW COMPARISON:  None Available. FINDINGS: Status post total right knee arthroplasty. No perihardware lucency is seen to indicate hardware failure or loosening. Borderline mild joint effusion. No acute fracture is seen. No dislocation. IMPRESSION: 1. Status post total right knee arthroplasty. No evidence of hardware failure or loosening. 2. Borderline mild joint effusion. 3. No acute fracture is seen. Electronically Signed   By: Neita Garnet M.D.   On: 12/28/2022 09:58    Procedures Procedures    Medications Ordered in ED Medications  acetaminophen (TYLENOL) tablet 1,000 mg (1,000 mg Oral Given 12/28/22 0930)    ED Course/ Medical Decision Making/ A&P                             Medical Decision Making Amount and/or Complexity of Data Reviewed Radiology: ordered.  Risk OTC drugs.   Medical Decision Making:   [name] is a 82 y.o. female who presented to the ED today with right knee pain after fall.   Reviewed and confirmed nursing documentation for past medical history, family history, social history.    Initial Assessment and plan:   Patient presenting after mechanical fall with right knee pain.  Vital signs reviewed.  She had no evidence of presyncope or syncope and no loss of conscious, low concern for arrhythmia or other medical  cause for her fall.  On exam she has some mild pain to the right knee which is status post knee replacement, no external signs of trauma.  She is neurovascularly intact.  No fever or findings concerning for septic arthritis or osteomyelitis.  Obtain plain film of the right knee to evaluate for hardware complication or fracture.  She has no other signs of trauma including no evidence of head or neck trauma.  She  is not on anticoagulation.  Do not think other imaging or lab work is indicated at this time.  EKG was obtained in triage.  Initial Study Results:    EKG EKG was reviewed independently. Rate, rhythm, axis, intervals all examined and without medically relevant abnormality. ST segments without concerns for elevations.  The computer interpretation noted atrial fibrillation but patient is in sinus rhythm on EKG.  Radiology:  All images reviewed independently.  X-ray of the right knee reviewed.  Hardware appears to be in good alignment without evidence of fracture.  Agree with radiology report at this time.   DG Knee 2 Views Right  Result Date: 12/28/2022 CLINICAL DATA:  History of total right knee arthroplasty. Fall with knee buckling last night. Knee has been giving out a lot recently. EXAM: RIGHT KNEE - 1-2 VIEW COMPARISON:  None Available. FINDINGS: Status post total right knee arthroplasty. No perihardware lucency is seen to indicate hardware failure or loosening. Borderline mild joint effusion. No acute fracture is seen. No dislocation. IMPRESSION: 1. Status post total right knee arthroplasty. No evidence of hardware failure or loosening. 2. Borderline mild joint effusion. 3. No acute fracture is seen. Electronically Signed   By: Neita Garnet M.D.   On: 12/28/2022 09:58     Reassessment and Plan:   On reassessment, pain is improved.  She is able to ambulate without assistance although has some mild pain.  Her x-rays are reassuring against fracture or hardware complication.  She remains he  medically stable.  Her grandson is here with her who was able to help her, and she has not had a independent living facility where she is able to get regular help with ambulation.  Her family is going to help her get a rollator at home, she states she will follow-up closely with her primary care provider.  I think this is reasonable given reassuring workup and safe disposition.  I encouraged her to be very careful at home to avoid further falls.  Given crutches here to help with ambulation in the meantime.  Orthopedic referral placed as well, recommend she follow-up with them closely given history of prior knee replacement with recurrent knee pain.  Patient's presentation is most consistent with acute complicated illness / injury requiring diagnostic workup.           Final Clinical Impression(s) / ED Diagnoses Final diagnoses:  Acute pain of right knee    Rx / DC Orders ED Discharge Orders     None         Laurence Spates, MD 12/28/22 1038

## 2022-12-28 NOTE — ED Notes (Signed)
Pt refused crutches and the walker... Provider aware... Pt stated that she had a walker at home.Marland KitchenMarland Kitchen

## 2022-12-31 DIAGNOSIS — I700 Atherosclerosis of aorta: Secondary | ICD-10-CM | POA: Diagnosis not present

## 2022-12-31 DIAGNOSIS — M179 Osteoarthritis of knee, unspecified: Secondary | ICD-10-CM | POA: Diagnosis not present

## 2022-12-31 DIAGNOSIS — M25361 Other instability, right knee: Secondary | ICD-10-CM | POA: Diagnosis not present

## 2022-12-31 DIAGNOSIS — E785 Hyperlipidemia, unspecified: Secondary | ICD-10-CM | POA: Diagnosis not present

## 2022-12-31 DIAGNOSIS — Z79899 Other long term (current) drug therapy: Secondary | ICD-10-CM | POA: Diagnosis not present

## 2022-12-31 DIAGNOSIS — F3340 Major depressive disorder, recurrent, in remission, unspecified: Secondary | ICD-10-CM | POA: Diagnosis not present

## 2023-01-07 DIAGNOSIS — M25561 Pain in right knee: Secondary | ICD-10-CM | POA: Diagnosis not present

## 2023-01-07 DIAGNOSIS — Z96652 Presence of left artificial knee joint: Secondary | ICD-10-CM | POA: Diagnosis not present

## 2023-01-07 DIAGNOSIS — Z96651 Presence of right artificial knee joint: Secondary | ICD-10-CM | POA: Diagnosis not present

## 2023-01-13 DIAGNOSIS — H5213 Myopia, bilateral: Secondary | ICD-10-CM | POA: Diagnosis not present

## 2023-01-13 DIAGNOSIS — H524 Presbyopia: Secondary | ICD-10-CM | POA: Diagnosis not present

## 2023-01-13 DIAGNOSIS — H25013 Cortical age-related cataract, bilateral: Secondary | ICD-10-CM | POA: Diagnosis not present

## 2023-01-13 DIAGNOSIS — H2513 Age-related nuclear cataract, bilateral: Secondary | ICD-10-CM | POA: Diagnosis not present

## 2023-01-19 DIAGNOSIS — R296 Repeated falls: Secondary | ICD-10-CM | POA: Diagnosis not present

## 2023-01-19 DIAGNOSIS — R2689 Other abnormalities of gait and mobility: Secondary | ICD-10-CM | POA: Diagnosis not present

## 2023-01-19 DIAGNOSIS — R2681 Unsteadiness on feet: Secondary | ICD-10-CM | POA: Diagnosis not present

## 2023-01-25 DIAGNOSIS — R2689 Other abnormalities of gait and mobility: Secondary | ICD-10-CM | POA: Diagnosis not present

## 2023-01-25 DIAGNOSIS — R2681 Unsteadiness on feet: Secondary | ICD-10-CM | POA: Diagnosis not present

## 2023-01-25 DIAGNOSIS — R296 Repeated falls: Secondary | ICD-10-CM | POA: Diagnosis not present

## 2023-01-27 DIAGNOSIS — R2689 Other abnormalities of gait and mobility: Secondary | ICD-10-CM | POA: Diagnosis not present

## 2023-01-27 DIAGNOSIS — R296 Repeated falls: Secondary | ICD-10-CM | POA: Diagnosis not present

## 2023-01-27 DIAGNOSIS — R2681 Unsteadiness on feet: Secondary | ICD-10-CM | POA: Diagnosis not present

## 2023-01-28 DIAGNOSIS — R2681 Unsteadiness on feet: Secondary | ICD-10-CM | POA: Diagnosis not present

## 2023-01-28 DIAGNOSIS — R2689 Other abnormalities of gait and mobility: Secondary | ICD-10-CM | POA: Diagnosis not present

## 2023-01-28 DIAGNOSIS — R296 Repeated falls: Secondary | ICD-10-CM | POA: Diagnosis not present

## 2023-01-31 DIAGNOSIS — R2681 Unsteadiness on feet: Secondary | ICD-10-CM | POA: Diagnosis not present

## 2023-01-31 DIAGNOSIS — R2689 Other abnormalities of gait and mobility: Secondary | ICD-10-CM | POA: Diagnosis not present

## 2023-01-31 DIAGNOSIS — R296 Repeated falls: Secondary | ICD-10-CM | POA: Diagnosis not present

## 2023-02-02 DIAGNOSIS — R296 Repeated falls: Secondary | ICD-10-CM | POA: Diagnosis not present

## 2023-02-02 DIAGNOSIS — R2689 Other abnormalities of gait and mobility: Secondary | ICD-10-CM | POA: Diagnosis not present

## 2023-02-02 DIAGNOSIS — R2681 Unsteadiness on feet: Secondary | ICD-10-CM | POA: Diagnosis not present

## 2023-02-03 DIAGNOSIS — R2689 Other abnormalities of gait and mobility: Secondary | ICD-10-CM | POA: Diagnosis not present

## 2023-02-03 DIAGNOSIS — R2681 Unsteadiness on feet: Secondary | ICD-10-CM | POA: Diagnosis not present

## 2023-02-03 DIAGNOSIS — R296 Repeated falls: Secondary | ICD-10-CM | POA: Diagnosis not present

## 2023-02-07 DIAGNOSIS — R2689 Other abnormalities of gait and mobility: Secondary | ICD-10-CM | POA: Diagnosis not present

## 2023-02-07 DIAGNOSIS — R2681 Unsteadiness on feet: Secondary | ICD-10-CM | POA: Diagnosis not present

## 2023-02-07 DIAGNOSIS — R296 Repeated falls: Secondary | ICD-10-CM | POA: Diagnosis not present

## 2023-02-09 DIAGNOSIS — K080 Exfoliation of teeth due to systemic causes: Secondary | ICD-10-CM | POA: Diagnosis not present

## 2023-02-09 DIAGNOSIS — R296 Repeated falls: Secondary | ICD-10-CM | POA: Diagnosis not present

## 2023-02-09 DIAGNOSIS — R2689 Other abnormalities of gait and mobility: Secondary | ICD-10-CM | POA: Diagnosis not present

## 2023-02-09 DIAGNOSIS — R2681 Unsteadiness on feet: Secondary | ICD-10-CM | POA: Diagnosis not present

## 2023-04-27 DIAGNOSIS — Z0000 Encounter for general adult medical examination without abnormal findings: Secondary | ICD-10-CM | POA: Diagnosis not present

## 2023-04-27 DIAGNOSIS — F3340 Major depressive disorder, recurrent, in remission, unspecified: Secondary | ICD-10-CM | POA: Diagnosis not present

## 2023-04-27 DIAGNOSIS — E559 Vitamin D deficiency, unspecified: Secondary | ICD-10-CM | POA: Diagnosis not present

## 2023-04-27 DIAGNOSIS — Z79899 Other long term (current) drug therapy: Secondary | ICD-10-CM | POA: Diagnosis not present

## 2023-04-27 DIAGNOSIS — R262 Difficulty in walking, not elsewhere classified: Secondary | ICD-10-CM | POA: Diagnosis not present

## 2023-04-27 DIAGNOSIS — M179 Osteoarthritis of knee, unspecified: Secondary | ICD-10-CM | POA: Diagnosis not present

## 2023-05-17 DIAGNOSIS — K080 Exfoliation of teeth due to systemic causes: Secondary | ICD-10-CM | POA: Diagnosis not present

## 2023-06-04 ENCOUNTER — Other Ambulatory Visit: Payer: Medicare Other

## 2023-06-06 ENCOUNTER — Other Ambulatory Visit: Payer: Medicare Other

## 2023-06-07 ENCOUNTER — Ambulatory Visit
Admission: RE | Admit: 2023-06-07 | Discharge: 2023-06-07 | Disposition: A | Discharge status: Home / Self Care | Payer: Medicare Other | Source: Ambulatory Visit | Attending: Internal Medicine | Admitting: Internal Medicine

## 2023-06-07 DIAGNOSIS — E2839 Other primary ovarian failure: Secondary | ICD-10-CM | POA: Diagnosis not present

## 2023-06-07 DIAGNOSIS — M8588 Other specified disorders of bone density and structure, other site: Secondary | ICD-10-CM | POA: Diagnosis not present

## 2023-06-07 DIAGNOSIS — N958 Other specified menopausal and perimenopausal disorders: Secondary | ICD-10-CM | POA: Diagnosis not present

## 2023-06-07 DIAGNOSIS — M85859 Other specified disorders of bone density and structure, unspecified thigh: Secondary | ICD-10-CM

## 2023-06-17 ENCOUNTER — Ambulatory Visit (HOSPITAL_COMMUNITY)
Admission: EM | Admit: 2023-06-17 | Discharge: 2023-06-17 | Disposition: A | Discharge status: Home / Self Care | Payer: Medicare Other | Attending: Family Medicine | Admitting: Family Medicine

## 2023-06-17 ENCOUNTER — Encounter (HOSPITAL_COMMUNITY): Payer: Self-pay

## 2023-06-17 DIAGNOSIS — J069 Acute upper respiratory infection, unspecified: Secondary | ICD-10-CM | POA: Insufficient documentation

## 2023-06-17 LAB — POC COVID19/FLU A&B COMBO
Covid Antigen, POC: NEGATIVE
Influenza A Antigen, POC: NEGATIVE
Influenza B Antigen, POC: NEGATIVE

## 2023-06-17 LAB — SARS CORONAVIRUS 2 (TAT 6-24 HRS): SARS Coronavirus 2: NEGATIVE

## 2023-06-17 MED ORDER — BENZONATATE 100 MG PO CAPS: 100.0000 mg | ORAL_CAPSULE | Freq: Three times a day (TID) | ORAL | 0 refills | Status: AC | PRN

## 2023-06-17 NOTE — ED Triage Notes (Signed)
Patient here today with c/o ST, cough, nasal drainage X 2 days. Patient states that she did feel a little fever on Christmas morning. She lives in senior living and Merleen Nicely has been going around. She has tried taking Tylenol with no relief.

## 2023-06-17 NOTE — Discharge Instructions (Addendum)
The point-of-care flu and COVID test here was negative.  Take benzonatate 100 mg, 1 tab every 8 hours as needed for cough.  You have been swabbed for COVID with our PCR test, and the test will result in the next 24 hours. Our staff will call you if positive. If the COVID test is positive, you should quarantine until you are fever free for 24 hours and you are starting to feel better, and then take added precautions for the next 5 days, such as physical distancing/wearing a mask and good hand hygiene/washing.  If you do end up testing positive with this test for COVID and need to take the antiviral Paxlovid, you would not take atorvastatin while you are taking the Paxlovid.

## 2023-06-17 NOTE — ED Provider Notes (Signed)
[facility]    CSN: [id number] Arrival date & time: 06/17/23  1324      History   Chief Complaint Chief Complaint  Patient presents with   Sore Throat    HPI Tammy Kelly is a 82 y.o. female.    Sore Throat  Here for nasal congestion and rhinorrhea, cough, and sore throat.  She had some subjective fever when it first started.  Symptoms began on the morning of December 25.  No nausea or vomiting or diarrhea  Her chest maybe has felt heavy.  She has maybe had a little left ear pain but no right ear pain.  She has no allergy to medications    Past Medical History:  Diagnosis Date   Anxiety    Depression    High cholesterol     There are no active problems to display for this patient.   History reviewed. No pertinent surgical history.  OB History   No obstetric history on file.      Home Medications    Prior to Admission medications   Medication Sig Start Date End Date Taking? Authorizing Provider  calcium carbonate (OSCAL) 1500 (600 Ca) MG TABS tablet Take by mouth 2 (two) times daily with a meal.   Yes [provider]  Multiple Vitamins-Minerals (PRESERVISION AREDS PO) Take by mouth.   Yes [provider]  traZODone (DESYREL) 50 MG tablet Take 50 mg by mouth at bedtime as needed. 06/09/22 06/08/24 Yes [provider]  triamcinolone ointment (KENALOG) 0.1 % Apply 1 Application topically. 08/06/22 08/06/24 Yes [provider]  atorvastatin (LIPITOR) 10 MG tablet Take 10 mg by mouth daily.    [provider]  mirtazapine (REMERON) 15 MG tablet Take 15 mg by mouth at bedtime.    [provider]    Family History History reviewed. No pertinent family history.  Social History Social History   Tobacco Use   Smoking status: Never   Smokeless tobacco: Never  Vaping Use   Vaping status: Never Used  Substance Use Topics   Alcohol use: Yes    Alcohol/week: 1.0 standard drink of alcohol     Types: 1 Glasses of wine per week    Comment: Wine Occ   Drug use: Never     Allergies   Patient has no known allergies.   Review of Systems Review of Systems   Physical Exam Triage Vital Signs ED Triage Vitals  Encounter Vitals Group     BP 06/17/23 1007 135/84     Systolic BP Percentile --      Diastolic BP Percentile --      Pulse Rate 06/17/23 1007 77     Resp 06/17/23 1007 16     Temp 06/17/23 1007 98.1 F (36.7 C)     Temp Source 06/17/23 1007 Oral     SpO2 06/17/23 1007 94 %     Weight --      Height 06/17/23 1006 5' (1.524 m)     Head Circumference --      Peak Flow --      Pain Score 06/17/23 1003 2     Pain Loc --      Pain Education --      Exclude from Growth Chart --    No data found.  Updated Vital Signs BP 135/84 (BP Location: Left Arm)   Pulse 77   Temp 98.1 F (36.7 C) (Oral)   Resp 16   Ht 5' (1.524  m)   SpO2 94%   BMI 31.25 kg/m   Visual Acuity Right Eye Distance:   Left Eye Distance:   Bilateral Distance:    Right Eye Near:   Left Eye Near:    Bilateral Near:     Physical Exam Vitals reviewed.  Constitutional:      General: She is not in acute distress.    Appearance: She is not ill-appearing, toxic-appearing or diaphoretic.  HENT:     Right Ear: Tympanic membrane and ear canal normal.     Left Ear: Tympanic membrane and ear canal normal.     Nose: Congestion present.     Mouth/Throat:     Mouth: Mucous membranes are moist.     Comments: There is some erythema posteriorly with some clear mucus draining.  No asymmetry Eyes:     Extraocular Movements: Extraocular movements intact.     Conjunctiva/sclera: Conjunctivae normal.     Pupils: Pupils are equal, round, and reactive to light.  Cardiovascular:     Rate and Rhythm: Normal rate and regular rhythm.     Heart sounds: No murmur heard. Pulmonary:     Effort: Pulmonary effort is normal. No respiratory distress.     Breath sounds: No stridor. No wheezing, rhonchi or  rales.  Musculoskeletal:     Cervical back: Neck supple.  Lymphadenopathy:     Cervical: No cervical adenopathy.  Skin:    Capillary Refill: Capillary refill takes less than 2 seconds.     Coloration: Skin is not jaundiced or pale.  Neurological:     General: No focal deficit present.     Mental Status: She is alert and oriented to person, place, and time.  Psychiatric:        Behavior: Behavior normal.      UC Treatments / Results  Labs (all labs ordered are listed, but only abnormal results are displayed) Labs Reviewed  POC COVID19/FLU A&B COMBO    EKG   Radiology No results found.  Procedures Procedures (including critical care time)  Medications Ordered in UC Medications - No data to display  Initial Impression / Assessment and Plan / UC Course  I have reviewed the triage vital signs and the nursing notes.  Pertinent labs & imaging results that were available during my care of the patient were reviewed by me and considered in my medical decision making (see chart for details).     Point-of-care flu and COVID test is negative.  Tessalon Perles are sent for the cough  Since there has been so much potential exposure to COVID where she lives, and she would benefit from Paxlovid if she were to have COVID, then I am going to have staff do a PCR COVID test before she leaves.  If positive she is a candidate for Paxlovid normal dosing.  Last EGFR was greater than 60 earlier this year.  She would just need to stop taking her atorvastatin the week she takes the Paxlovid, if the COVID test is positive   Final Clinical Impressions(s) / UC Diagnoses   Final diagnoses:  None     Discharge Instructions      The point-of-care flu and COVID test here was negative.  Take benzonatate 100 mg, 1 tab every 8 hours as needed for cough.   You have been swabbed for COVID with our PCR test, and the test will result in the next 24 hours. Our staff will call you if positive.  If the COVID test is positive,  you should quarantine until you are fever free for 24 hours and you are starting to feel better, and then take added precautions for the next 5 days, such as physical distancing/wearing a mask and good hand hygiene/washing.  If you do end up testing positive with this test for COVID and need to take the antiviral Paxlovid, you would not take atorvastatin while you are taking the Paxlovid.      ED Prescriptions   None    PDMP not reviewed this encounter.   Zenia Resides, MD 06/17/23 1055

## 2023-06-23 ENCOUNTER — Emergency Department (HOSPITAL_BASED_OUTPATIENT_CLINIC_OR_DEPARTMENT_OTHER)
Admission: EM | Admit: 2023-06-23 | Discharge: 2023-06-23 | Disposition: A | Discharge status: Home / Self Care | Payer: Medicare Other | Attending: Emergency Medicine | Admitting: Emergency Medicine

## 2023-06-23 ENCOUNTER — Other Ambulatory Visit: Payer: Self-pay

## 2023-06-23 ENCOUNTER — Encounter (HOSPITAL_BASED_OUTPATIENT_CLINIC_OR_DEPARTMENT_OTHER): Payer: Self-pay | Admitting: Emergency Medicine

## 2023-06-23 ENCOUNTER — Other Ambulatory Visit (HOSPITAL_BASED_OUTPATIENT_CLINIC_OR_DEPARTMENT_OTHER): Payer: Self-pay

## 2023-06-23 ENCOUNTER — Emergency Department (HOSPITAL_BASED_OUTPATIENT_CLINIC_OR_DEPARTMENT_OTHER): Payer: Medicare Other | Admitting: Radiology

## 2023-06-23 DIAGNOSIS — Z20822 Contact with and (suspected) exposure to covid-19: Secondary | ICD-10-CM | POA: Insufficient documentation

## 2023-06-23 DIAGNOSIS — R059 Cough, unspecified: Secondary | ICD-10-CM | POA: Diagnosis not present

## 2023-06-23 DIAGNOSIS — J069 Acute upper respiratory infection, unspecified: Secondary | ICD-10-CM | POA: Diagnosis not present

## 2023-06-23 DIAGNOSIS — J189 Pneumonia, unspecified organism: Secondary | ICD-10-CM | POA: Diagnosis not present

## 2023-06-23 DIAGNOSIS — J029 Acute pharyngitis, unspecified: Secondary | ICD-10-CM | POA: Diagnosis not present

## 2023-06-23 LAB — RESP PANEL BY RT-PCR (RSV, FLU A&B, COVID)  RVPGX2
Influenza A by PCR: NEGATIVE
Influenza B by PCR: NEGATIVE
Resp Syncytial Virus by PCR: NEGATIVE
SARS Coronavirus 2 by RT PCR: NEGATIVE

## 2023-06-23 MED ORDER — AZITHROMYCIN 250 MG PO TABS
250.0000 mg | ORAL_TABLET | Freq: Every day | ORAL | 0 refills | Status: DC
  Filled 2023-06-23: qty 6, 5d supply, fill #0

## 2023-06-23 MED ORDER — AZITHROMYCIN 250 MG PO TABS: 250.0000 mg | ORAL_TABLET | Freq: Every day | ORAL | 0 refills | Status: AC

## 2023-06-23 NOTE — ED Provider Notes (Signed)
 [place] EMERGENCY DEPARTMENT AT [facility] Provider Note  CSN: [id number] Arrival date & time: 06/23/23 0746  Chief Complaint(s) Cough  HPI TORSHA LEMUS is a sprightly 83 y.o. female here today for cough.  Patient started off 1 week ago with a sore throat, nasal congestion, and since has developed a cough.  She is living in an assisted living facility where there is a lot of patients with COVID.  She went to an urgent care over the last couple of days, had negative COVID test done at that time.  She is here today because people at her assisted living facility wanted her to have chest x-ray.  She denies fever or chills.  Patient has lost her voice, but says that her sore throat has resolved.   Past Medical History Past Medical History:  Diagnosis Date   Anxiety    Depression    High cholesterol    There are no active problems to display for this patient.  Home Medication(s) Prior to Admission medications   Medication Sig Start Date End Date Taking? Authorizing Provider  atorvastatin (LIPITOR) 10 MG tablet Take 10 mg by mouth daily.    [provider]  azithromycin  (ZITHROMAX ) 250 MG tablet Take first 2 tablets together, then 1 every day until finished. 06/23/23   Mannie Pac T, DO  benzonatate  (TESSALON ) 100 MG capsule Take 1 capsule (100 mg total) by mouth 3 (three) times daily as needed for cough. 06/17/23   Banister, Pamela K, MD  calcium carbonate (OSCAL) 1500 (600 Ca) MG TABS tablet Take by mouth 2 (two) times daily with a meal.    [provider]  mirtazapine (REMERON) 15 MG tablet Take 15 mg by mouth at bedtime.    [provider]  Multiple Vitamins-Minerals (PRESERVISION AREDS PO) Take by mouth.    [provider]  traZODone (DESYREL) 50 MG tablet Take 50 mg by mouth at bedtime as needed. 06/09/22 06/08/24  [provider]  triamcinolone ointment (KENALOG) 0.1 % Apply 1 Application topically. 08/06/22 08/06/24   [provider]                                                                                                                                    Past Surgical History Past Surgical History:  Procedure Laterality Date   ABDOMINAL HYSTERECTOMY     JOINT REPLACEMENT     Family History History reviewed. No pertinent family history.  Social History Social History   Tobacco Use   Smoking status: Never   Smokeless tobacco: Never  Vaping Use   Vaping status: Never Used  Substance Use Topics   Alcohol use: Yes    Alcohol/week: 1.0 standard drink of alcohol    Types: 1 Glasses of wine per week    Comment: Wine Occ   Drug use: Never   Allergies Patient has no known allergies.  Review of Systems Review of  Systems  Physical Exam Vital Signs  I have reviewed the triage vital signs BP (!) 148/67   Pulse 60   Temp 98.2 F (36.8 C) (Oral)   Resp 16   Wt 81.6 kg   SpO2 100%   BMI 35.15 kg/m   Physical Exam HENT:     Nose: No congestion.     Mouth/Throat:     Mouth: Mucous membranes are moist.     Pharynx: No oropharyngeal exudate or posterior oropharyngeal erythema.  Cardiovascular:     Rate and Rhythm: Normal rate.  Pulmonary:     Effort: Pulmonary effort is normal. No respiratory distress.     Breath sounds: No wheezing or rales.  Skin:    General: Skin is warm.  Neurological:     General: No focal deficit present.     Mental Status: She is alert.     Gait: Gait normal.     ED Results and Treatments Labs (all labs ordered are listed, but only abnormal results are displayed) Labs Reviewed  RESP PANEL BY RT-PCR (RSV, FLU A&B, COVID)  RVPGX2                                                                                                                          Radiology DG Chest 2 View Result Date: 06/23/2023 CLINICAL DATA:  Cough with sore throat for 1 week. Concern for pneumonia. EXAM: CHEST - 2 VIEW COMPARISON:  None Available. FINDINGS: The heart  size and mediastinal contours are normal. The lungs are clear. There is no pleural effusion or pneumothorax. No acute osseous findings are identified. Scattered thoracic spondylosis. IMPRESSION: No evidence of acute cardiopulmonary process. Electronically Signed   By: Elsie Perone M.D.   On: 06/23/2023 08:53    Pertinent labs & imaging results that were available during my care of the patient were reviewed by me and considered in my medical decision making (see MDM for details).  Medications Ordered in ED Medications - No data to display                                                                                                                                   Procedures Procedures  (including critical care time)  Medical Decision Making / ED Course   This patient presents to the ED for concern of cough, this involves an extensive number of treatment options, and is  a complaint that carries with it a high risk of complications and morbidity.  The differential diagnosis includes viral syndrome, pneumonia, bronchitis  MDM: On assessment, patient overall looks well.  Patient quite hoarse, but says that her throat no longer feels sore.  Looks okay on exam.  Lungs are clear.  Reassuring vital signs.  Reviewed the patient's chest x-ray, no pneumonia.  Likely viral syndrome.  Sent flu, COVID and RSV on the patient.   Additional history obtained: -Additional history obtained from  -External records from outside source obtained and reviewed including: Chart review including previous notes, labs, imaging, consultation notes   Lab Tests: -I ordered, reviewed, and interpreted labs.   The pertinent results include:   Labs Reviewed  RESP PANEL BY RT-PCR (RSV, FLU A&B, COVID)  RVPGX2      EKG   EKG Interpretation Date/Time:    Ventricular Rate:    PR Interval:    QRS Duration:    QT Interval:    QTC Calculation:   R Axis:      Text Interpretation:           Imaging  Studies ordered: I ordered imaging studies including chest x-ray I independently visualized and interpreted imaging. I agree with the radiologist interpretation   Medicines ordered and prescription drug management: Meds ordered this encounter  Medications   DISCONTD: azithromycin  (ZITHROMAX ) 250 MG tablet    Sig: Take first 2 tablets together, then 1 every day until finished.    Dispense:  6 tablet    Refill:  0   azithromycin  (ZITHROMAX ) 250 MG tablet    Sig: Take first 2 tablets together, then 1 every day until finished.    Dispense:  6 tablet    Refill:  0    -I have reviewed the patients home medicines and have made adjustments as needed  Social Determinants of Health:  Factors impacting patients care include: Advanced age   Reevaluation: After the interventions noted above, I reevaluated the patient and found that they have :improved  Co morbidities that complicate the patient evaluation  Past Medical History:  Diagnosis Date   Anxiety    Depression    High cholesterol       Dispostion: Discharge home     Final Clinical Impression(s) / ED Diagnoses Final diagnoses:  Upper respiratory tract infection, unspecified type     @PCDICTATION @    Mannie Pac T, DO 06/24/23 [phone]

## 2023-06-23 NOTE — Discharge Instructions (Addendum)
 Your chest x-ray was negative.  Like we discussed, this is likely a viral illness.  It should run its course over the next few days.  You can take Azithromycin  as instructed by the dose packaging.  Symptoms will likely continue for the next several days.  Return to the emergency department if you develop fever or increased difficulty with your breathing.  It was very nice to meet you both.

## 2023-06-23 NOTE — ED Triage Notes (Signed)
 Pt bib son, reports pt has concern for PNA. Pt endorses cough and sore throat x 1 week. Hoarseness noted. Denies sore throat today. Seen by pcp x 6 days pta, covid test neg. Deneis fever

## 2023-08-01 DIAGNOSIS — E559 Vitamin D deficiency, unspecified: Secondary | ICD-10-CM | POA: Diagnosis not present

## 2023-08-14 ENCOUNTER — Emergency Department (HOSPITAL_COMMUNITY)
Admission: EM | Admit: 2023-08-14 | Discharge: 2023-08-14 | Disposition: A | Discharge status: Home / Self Care | Payer: Medicare Other | Attending: Emergency Medicine | Admitting: Emergency Medicine

## 2023-08-14 ENCOUNTER — Encounter (HOSPITAL_COMMUNITY): Payer: Self-pay

## 2023-08-14 ENCOUNTER — Other Ambulatory Visit: Payer: Self-pay

## 2023-08-14 ENCOUNTER — Emergency Department (HOSPITAL_COMMUNITY): Payer: Medicare Other

## 2023-08-14 DIAGNOSIS — W010XXA Fall on same level from slipping, tripping and stumbling without subsequent striking against object, initial encounter: Secondary | ICD-10-CM | POA: Insufficient documentation

## 2023-08-14 DIAGNOSIS — W19XXXA Unspecified fall, initial encounter: Secondary | ICD-10-CM

## 2023-08-14 DIAGNOSIS — Y9301 Activity, walking, marching and hiking: Secondary | ICD-10-CM | POA: Diagnosis not present

## 2023-08-14 DIAGNOSIS — I10 Essential (primary) hypertension: Secondary | ICD-10-CM | POA: Diagnosis not present

## 2023-08-14 DIAGNOSIS — S42202A Unspecified fracture of upper end of left humerus, initial encounter for closed fracture: Secondary | ICD-10-CM | POA: Diagnosis not present

## 2023-08-14 DIAGNOSIS — S4982XA Other specified injuries of left shoulder and upper arm, initial encounter: Secondary | ICD-10-CM | POA: Diagnosis not present

## 2023-08-14 DIAGNOSIS — S42292A Other displaced fracture of upper end of left humerus, initial encounter for closed fracture: Secondary | ICD-10-CM | POA: Diagnosis not present

## 2023-08-14 DIAGNOSIS — S42295A Other nondisplaced fracture of upper end of left humerus, initial encounter for closed fracture: Secondary | ICD-10-CM | POA: Diagnosis not present

## 2023-08-14 DIAGNOSIS — S4992XA Unspecified injury of left shoulder and upper arm, initial encounter: Secondary | ICD-10-CM | POA: Diagnosis not present

## 2023-08-14 MED ORDER — FENTANYL CITRATE PF 50 MCG/ML IJ SOSY
25.0000 ug | PREFILLED_SYRINGE | Freq: Once | INTRAMUSCULAR | Status: AC
  Administered 2023-08-14: 25 ug via INTRAMUSCULAR
  Filled 2023-08-14: qty 1

## 2023-08-14 MED ORDER — OXYCODONE HCL 5 MG PO TABS: 5.0000 mg | ORAL_TABLET | ORAL | 0 refills | Status: AC | PRN

## 2023-08-14 MED ORDER — HYDROCODONE-ACETAMINOPHEN 5-325 MG PO TABS
1.0000 | ORAL_TABLET | Freq: Once | ORAL | Status: AC
  Administered 2023-08-14: 1 via ORAL
  Filled 2023-08-14: qty 1

## 2023-08-14 NOTE — ED Notes (Signed)
 Ortho Tech at bedside applying shoulder sling.

## 2023-08-14 NOTE — Progress Notes (Signed)
 Orthopedic Tech Progress Note Patient Details:  Tammy Kelly [birth year] [id number]  Ortho Devices Type of Ortho Device: Sling immobilizer Ortho Device/Splint Location: lue Ortho Device/Splint Interventions: Ordered, Application, Adjustment   Post Interventions Patient Tolerated: Well Instructions Provided: Care of device, Adjustment of device  Trinna Post 08/14/2023, 9:04 PM

## 2023-08-14 NOTE — ED Provider Notes (Signed)
 [place] [facility] AT [facility] Provider Note   CSN: [id number] Arrival date & time: 08/14/23  1757     History  Chief Complaint  Patient presents with   Tammy Kelly    Tammy Kelly is a 83 y.o. female.  83 year old female presents today for concern of a fall that occurred just prior to arrival.  She states that [place].  She states she was walking to the elevator and tripped within the elevator [place] getting her toe caught on it.  She fell directly onto her left arm/shoulder.  Denies any head injury or neck injury.  She is certain about this.  Not on any anticoagulation.  No other injury or complaints at this time.  States this was purely mechanical.  No prodromal symptoms.  The history is provided by the patient. No language interpreter was used.       Home Medications Prior to Admission medications   Medication Sig Start Date End Date Taking? Authorizing Provider  atorvastatin (LIPITOR) 10 MG tablet Take 10 mg by mouth daily.    [provider]  azithromycin (ZITHROMAX) 250 MG tablet Take first 2 tablets together, then 1 every day until finished. 06/23/23   Arletha Pili, DO  benzonatate (TESSALON) 100 MG capsule Take 1 capsule (100 mg total) by mouth 3 (three) times daily as needed for cough. 06/17/23   Zenia Resides, MD  calcium carbonate (OSCAL) 1500 (600 Ca) MG TABS tablet Take by mouth 2 (two) times daily with a meal.    [provider]  mirtazapine (REMERON) 15 MG tablet Take 15 mg by mouth at bedtime.    [provider]  Multiple Vitamins-Minerals (PRESERVISION AREDS PO) Take by mouth.    [provider]  traZODone (DESYREL) 50 MG tablet Take 50 mg by mouth at bedtime as needed. 06/09/22 06/08/24  [provider]  triamcinolone ointment (KENALOG) 0.1 % Apply 1 Application topically. 08/06/22 08/06/24  [provider]      Allergies    Patient has no known allergies.    Review  of Systems   Review of Systems  Constitutional:  Negative for fever.  Musculoskeletal:  Positive for arthralgias. Negative for joint swelling.  Neurological:  Negative for syncope.  All other systems reviewed and are negative.   Physical Exam Updated Vital Signs BP (!) 155/63   Pulse 76   Temp 98.2 F (36.8 C) (Oral)   Resp 20   Ht 5' (1.524 m)   Wt 77.1 kg   SpO2 99%   BMI 33.20 kg/m  Physical Exam Vitals and nursing note reviewed.  Constitutional:      General: She is not in acute distress.    Appearance: Normal appearance. She is not ill-appearing.  HENT:     Head: Normocephalic and atraumatic.     Nose: Nose normal.  Eyes:     Conjunctiva/sclera: Conjunctivae normal.  Cardiovascular:     Rate and Rhythm: Normal rate.  Pulmonary:     Effort: Pulmonary effort is normal. No respiratory distress.  Musculoskeletal:        General: Tenderness present. No deformity.     Cervical back: Normal range of motion.     Comments: Left arm Guarded close to the body.  Decreased range of motion secondary to pain.  Without visible deformity.  Neurovascularly intact.  Tenderness to palpation to the left shoulder present.  Skin:    Findings: No rash.  Neurological:     Mental  Status: She is alert.     ED Results / Procedures / Treatments   Labs (all labs ordered are listed, but only abnormal results are displayed) Labs Reviewed - No data to display  EKG None  Radiology DG Humerus Left Result Date: 08/14/2023 CLINICAL DATA:  Fall, left arm pain EXAM: LEFT HUMERUS - 2+ VIEW; LEFT SHOULDER - 2+ VIEW COMPARISON:  None Available. FINDINGS: Acute comminuted fracture of the left humeral head and neck. Nondisplaced fracture through the surgical neck. Vertically oriented fracture through the greater tuberosity with approximately 1 cm of lateral displacement. Glenohumeral joint alignment is maintained without dislocation. 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 Distal aspect of the right humerus is intact without fracture. IMPRESSION: Acute comminuted fracture of the left humeral head and neck. Electronically Signed   By: Duanne Guess D.O.   On: 08/14/2023 18:54   DG Shoulder Left Result Date: 08/14/2023 CLINICAL DATA:  Fall, left arm pain EXAM: LEFT HUMERUS - 2+ VIEW; LEFT SHOULDER - 2+ VIEW COMPARISON:  None Available. FINDINGS: Acute comminuted fracture of the left humeral head and neck. Nondisplaced fracture through the surgical neck. Vertically oriented fracture through the greater tuberosity with approximately 1 cm of lateral displacement. Glenohumeral joint alignment is maintained without dislocation. 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 Distal aspect of the right humerus is intact without fracture. IMPRESSION: Acute comminuted fracture of the left humeral head and neck. Electronically Signed   By: Duanne Guess D.O.   On: 08/14/2023 18:54    Procedures Procedures    Medications Ordered in ED Medications  HYDROcodone-acetaminophen (NORCO/VICODIN) 5-325 MG per tablet 1 tablet (1 tablet Oral Given 08/14/23 1915)  fentaNYL (SUBLIMAZE) injection 25 mcg (25 mcg Intramuscular Given 08/14/23 2020)    ED Course/ Medical Decision Making/ A&P                                 Medical Decision Making Amount and/or Complexity of Data Reviewed Radiology: ordered.  Risk Prescription drug management.   Medical Decision Making / ED Course   This patient presents to the ED for concern of left shoulder pain, this involves an extensive number of treatment options, and is a complaint that carries with it a high risk of complications and morbidity.  The differential diagnosis includes fracture, muscle strain, contusion, head injury  MDM: 83 year old female presents today for concern of a fall that occurred just prior to arrival.  Denies head injury or loss of consciousness.  X-rays obtained of the  left shoulder and left humerus.  Comminuted fracture of the proximal left humerus.  Neurovascularly intact.  Pain well-controlled in the emergency department.  She is stable to go home.  She will follow-up with orthopedist.  Return precaution discussed.   Lab Tests: -I ordered, reviewed, and interpreted labs.   The pertinent results include:   Labs Reviewed - No data to display    EKG  EKG Interpretation Date/Time:    Ventricular Rate:    PR Interval:    QRS Duration:    QT Interval:    QTC Calculation:   R Axis:      Text Interpretation:           Imaging Studies ordered: I ordered imaging studies including left shoulder, left humerus I independently visualized and interpreted imaging. I agree with the radiologist interpretation   Medicines ordered and prescription drug management: Meds ordered this encounter  Medications  HYDROcodone-acetaminophen (NORCO/VICODIN) 5-325 MG per tablet 1 tablet    Refill:  0   fentaNYL (SUBLIMAZE) injection 25 mcg   oxyCODONE (ROXICODONE) 5 MG immediate release tablet    Sig: Take 1 tablet (5 mg total) by mouth every 4 (four) hours as needed for severe pain (pain score 7-10).    Dispense:  12 tablet    Refill:  0    Supervising Provider:   Hyacinth Meeker, BRIAN [[id number]]    -I have reviewed the patients home medicines and have made adjustments as needed  Reevaluation: After the interventions noted above, I reevaluated the patient and found that they have :improved  Co morbidities that complicate the patient evaluation  Past Medical History:  Diagnosis Date   Anxiety    Depression    High cholesterol       Dispostion: Discharged in stable condition.  Return precautions discussed.  Patient voices understanding and is in agreement with plan.   Final Clinical Impression(s) / ED Diagnoses Final diagnoses:  Closed fracture of proximal end of left humerus, unspecified fracture morphology, initial encounter  Fall, initial encounter     Rx / DC Orders ED Discharge Orders     None         Marita Kansas, PA-C 08/14/23 2203    Pricilla Loveless, MD 08/17/23 1104

## 2023-08-14 NOTE — Discharge Instructions (Signed)
 You have a fracture of your left humerus bone in the left arm.  You are given a sling and shoulder immobilizer.  I have sent in pain medication into the pharmacy for you.  This pain medication will make you drowsy and put you at an increased risk of falling.  Do not take this unless you absolutely need this.  Otherwise take Tylenol and ibuprofen to control the pain.  You can take up to 1000 mg of Tylenol every 6 hours.  Take 600 mg of ibuprofen every 6-8 hours.  Please call the orthopedist I have listed above to schedule a follow-up appointment for further management of the fracture.

## 2023-08-14 NOTE — ED Triage Notes (Signed)
 Pt BIB [facility] from [place], pt was walking to the elevator and tripped and landed on her left arm. Pt is unable to move left arm, some swelling noted.

## 2023-08-17 DIAGNOSIS — M62512 Muscle wasting and atrophy, not elsewhere classified, left shoulder: Secondary | ICD-10-CM | POA: Diagnosis not present

## 2023-08-17 DIAGNOSIS — R296 Repeated falls: Secondary | ICD-10-CM | POA: Diagnosis not present

## 2023-08-17 DIAGNOSIS — R278 Other lack of coordination: Secondary | ICD-10-CM | POA: Diagnosis not present

## 2023-08-22 DIAGNOSIS — S42302A Unspecified fracture of shaft of humerus, left arm, initial encounter for closed fracture: Secondary | ICD-10-CM | POA: Diagnosis not present

## 2023-08-23 DIAGNOSIS — R296 Repeated falls: Secondary | ICD-10-CM | POA: Diagnosis not present

## 2023-08-23 DIAGNOSIS — R278 Other lack of coordination: Secondary | ICD-10-CM | POA: Diagnosis not present

## 2023-08-23 DIAGNOSIS — M62512 Muscle wasting and atrophy, not elsewhere classified, left shoulder: Secondary | ICD-10-CM | POA: Diagnosis not present

## 2023-08-25 DIAGNOSIS — R296 Repeated falls: Secondary | ICD-10-CM | POA: Diagnosis not present

## 2023-08-25 DIAGNOSIS — R278 Other lack of coordination: Secondary | ICD-10-CM | POA: Diagnosis not present

## 2023-08-25 DIAGNOSIS — M62512 Muscle wasting and atrophy, not elsewhere classified, left shoulder: Secondary | ICD-10-CM | POA: Diagnosis not present

## 2023-08-29 DIAGNOSIS — R296 Repeated falls: Secondary | ICD-10-CM | POA: Diagnosis not present

## 2023-08-29 DIAGNOSIS — M62512 Muscle wasting and atrophy, not elsewhere classified, left shoulder: Secondary | ICD-10-CM | POA: Diagnosis not present

## 2023-08-29 DIAGNOSIS — R278 Other lack of coordination: Secondary | ICD-10-CM | POA: Diagnosis not present

## 2023-08-31 DIAGNOSIS — M62512 Muscle wasting and atrophy, not elsewhere classified, left shoulder: Secondary | ICD-10-CM | POA: Diagnosis not present

## 2023-08-31 DIAGNOSIS — R278 Other lack of coordination: Secondary | ICD-10-CM | POA: Diagnosis not present

## 2023-08-31 DIAGNOSIS — R296 Repeated falls: Secondary | ICD-10-CM | POA: Diagnosis not present

## 2023-09-05 DIAGNOSIS — M62512 Muscle wasting and atrophy, not elsewhere classified, left shoulder: Secondary | ICD-10-CM | POA: Diagnosis not present

## 2023-09-05 DIAGNOSIS — R296 Repeated falls: Secondary | ICD-10-CM | POA: Diagnosis not present

## 2023-09-05 DIAGNOSIS — R278 Other lack of coordination: Secondary | ICD-10-CM | POA: Diagnosis not present

## 2023-09-08 DIAGNOSIS — R278 Other lack of coordination: Secondary | ICD-10-CM | POA: Diagnosis not present

## 2023-09-08 DIAGNOSIS — M62512 Muscle wasting and atrophy, not elsewhere classified, left shoulder: Secondary | ICD-10-CM | POA: Diagnosis not present

## 2023-09-08 DIAGNOSIS — R296 Repeated falls: Secondary | ICD-10-CM | POA: Diagnosis not present

## 2023-09-12 DIAGNOSIS — M62512 Muscle wasting and atrophy, not elsewhere classified, left shoulder: Secondary | ICD-10-CM | POA: Diagnosis not present

## 2023-09-12 DIAGNOSIS — R278 Other lack of coordination: Secondary | ICD-10-CM | POA: Diagnosis not present

## 2023-09-12 DIAGNOSIS — R296 Repeated falls: Secondary | ICD-10-CM | POA: Diagnosis not present

## 2023-09-14 DIAGNOSIS — R296 Repeated falls: Secondary | ICD-10-CM | POA: Diagnosis not present

## 2023-09-14 DIAGNOSIS — R278 Other lack of coordination: Secondary | ICD-10-CM | POA: Diagnosis not present

## 2023-09-14 DIAGNOSIS — M62512 Muscle wasting and atrophy, not elsewhere classified, left shoulder: Secondary | ICD-10-CM | POA: Diagnosis not present

## 2023-09-21 DIAGNOSIS — R278 Other lack of coordination: Secondary | ICD-10-CM | POA: Diagnosis not present

## 2023-09-21 DIAGNOSIS — M62512 Muscle wasting and atrophy, not elsewhere classified, left shoulder: Secondary | ICD-10-CM | POA: Diagnosis not present

## 2023-09-21 DIAGNOSIS — R296 Repeated falls: Secondary | ICD-10-CM | POA: Diagnosis not present

## 2023-09-22 DIAGNOSIS — M62512 Muscle wasting and atrophy, not elsewhere classified, left shoulder: Secondary | ICD-10-CM | POA: Diagnosis not present

## 2023-09-22 DIAGNOSIS — R278 Other lack of coordination: Secondary | ICD-10-CM | POA: Diagnosis not present

## 2023-09-22 DIAGNOSIS — R296 Repeated falls: Secondary | ICD-10-CM | POA: Diagnosis not present

## 2023-09-26 DIAGNOSIS — S42202D Unspecified fracture of upper end of left humerus, subsequent encounter for fracture with routine healing: Secondary | ICD-10-CM | POA: Diagnosis not present

## 2023-09-27 DIAGNOSIS — R296 Repeated falls: Secondary | ICD-10-CM | POA: Diagnosis not present

## 2023-09-27 DIAGNOSIS — M62512 Muscle wasting and atrophy, not elsewhere classified, left shoulder: Secondary | ICD-10-CM | POA: Diagnosis not present

## 2023-09-27 DIAGNOSIS — R278 Other lack of coordination: Secondary | ICD-10-CM | POA: Diagnosis not present

## 2023-09-29 DIAGNOSIS — M62512 Muscle wasting and atrophy, not elsewhere classified, left shoulder: Secondary | ICD-10-CM | POA: Diagnosis not present

## 2023-09-29 DIAGNOSIS — R278 Other lack of coordination: Secondary | ICD-10-CM | POA: Diagnosis not present

## 2023-09-29 DIAGNOSIS — R296 Repeated falls: Secondary | ICD-10-CM | POA: Diagnosis not present

## 2023-10-06 DIAGNOSIS — M62512 Muscle wasting and atrophy, not elsewhere classified, left shoulder: Secondary | ICD-10-CM | POA: Diagnosis not present

## 2023-10-06 DIAGNOSIS — R296 Repeated falls: Secondary | ICD-10-CM | POA: Diagnosis not present

## 2023-10-06 DIAGNOSIS — R278 Other lack of coordination: Secondary | ICD-10-CM | POA: Diagnosis not present

## 2023-10-07 DIAGNOSIS — R278 Other lack of coordination: Secondary | ICD-10-CM | POA: Diagnosis not present

## 2023-10-07 DIAGNOSIS — R296 Repeated falls: Secondary | ICD-10-CM | POA: Diagnosis not present

## 2023-10-07 DIAGNOSIS — M62512 Muscle wasting and atrophy, not elsewhere classified, left shoulder: Secondary | ICD-10-CM | POA: Diagnosis not present

## 2023-10-10 DIAGNOSIS — R278 Other lack of coordination: Secondary | ICD-10-CM | POA: Diagnosis not present

## 2023-10-10 DIAGNOSIS — R296 Repeated falls: Secondary | ICD-10-CM | POA: Diagnosis not present

## 2023-10-10 DIAGNOSIS — M62512 Muscle wasting and atrophy, not elsewhere classified, left shoulder: Secondary | ICD-10-CM | POA: Diagnosis not present

## 2023-10-12 DIAGNOSIS — R278 Other lack of coordination: Secondary | ICD-10-CM | POA: Diagnosis not present

## 2023-10-12 DIAGNOSIS — R296 Repeated falls: Secondary | ICD-10-CM | POA: Diagnosis not present

## 2023-10-12 DIAGNOSIS — M62512 Muscle wasting and atrophy, not elsewhere classified, left shoulder: Secondary | ICD-10-CM | POA: Diagnosis not present

## 2023-10-14 DIAGNOSIS — H9193 Unspecified hearing loss, bilateral: Secondary | ICD-10-CM | POA: Diagnosis not present

## 2023-10-18 DIAGNOSIS — M62512 Muscle wasting and atrophy, not elsewhere classified, left shoulder: Secondary | ICD-10-CM | POA: Diagnosis not present

## 2023-10-18 DIAGNOSIS — R278 Other lack of coordination: Secondary | ICD-10-CM | POA: Diagnosis not present

## 2023-10-18 DIAGNOSIS — R296 Repeated falls: Secondary | ICD-10-CM | POA: Diagnosis not present

## 2023-10-21 DIAGNOSIS — M62512 Muscle wasting and atrophy, not elsewhere classified, left shoulder: Secondary | ICD-10-CM | POA: Diagnosis not present

## 2023-10-21 DIAGNOSIS — R278 Other lack of coordination: Secondary | ICD-10-CM | POA: Diagnosis not present

## 2023-10-21 DIAGNOSIS — R296 Repeated falls: Secondary | ICD-10-CM | POA: Diagnosis not present

## 2023-10-25 DIAGNOSIS — R278 Other lack of coordination: Secondary | ICD-10-CM | POA: Diagnosis not present

## 2023-10-25 DIAGNOSIS — R296 Repeated falls: Secondary | ICD-10-CM | POA: Diagnosis not present

## 2023-10-25 DIAGNOSIS — M62512 Muscle wasting and atrophy, not elsewhere classified, left shoulder: Secondary | ICD-10-CM | POA: Diagnosis not present

## 2023-10-27 DIAGNOSIS — R296 Repeated falls: Secondary | ICD-10-CM | POA: Diagnosis not present

## 2023-10-27 DIAGNOSIS — M62512 Muscle wasting and atrophy, not elsewhere classified, left shoulder: Secondary | ICD-10-CM | POA: Diagnosis not present

## 2023-10-27 DIAGNOSIS — R278 Other lack of coordination: Secondary | ICD-10-CM | POA: Diagnosis not present

## 2023-10-31 ENCOUNTER — Encounter: Payer: Self-pay | Admitting: Internal Medicine

## 2023-10-31 DIAGNOSIS — Z0000 Encounter for general adult medical examination without abnormal findings: Secondary | ICD-10-CM | POA: Diagnosis not present

## 2023-10-31 DIAGNOSIS — E785 Hyperlipidemia, unspecified: Secondary | ICD-10-CM | POA: Diagnosis not present

## 2023-10-31 DIAGNOSIS — R635 Abnormal weight gain: Secondary | ICD-10-CM | POA: Diagnosis not present

## 2023-10-31 DIAGNOSIS — G4700 Insomnia, unspecified: Secondary | ICD-10-CM | POA: Diagnosis not present

## 2023-10-31 DIAGNOSIS — E559 Vitamin D deficiency, unspecified: Secondary | ICD-10-CM | POA: Diagnosis not present

## 2023-11-01 ENCOUNTER — Other Ambulatory Visit: Payer: Self-pay | Admitting: Internal Medicine

## 2023-11-01 ENCOUNTER — Encounter: Payer: Self-pay | Admitting: Internal Medicine

## 2023-11-01 DIAGNOSIS — R278 Other lack of coordination: Secondary | ICD-10-CM | POA: Diagnosis not present

## 2023-11-01 DIAGNOSIS — Z8781 Personal history of (healed) traumatic fracture: Secondary | ICD-10-CM

## 2023-11-01 DIAGNOSIS — M62512 Muscle wasting and atrophy, not elsewhere classified, left shoulder: Secondary | ICD-10-CM | POA: Diagnosis not present

## 2023-11-01 DIAGNOSIS — R296 Repeated falls: Secondary | ICD-10-CM | POA: Diagnosis not present

## 2023-11-02 DIAGNOSIS — R296 Repeated falls: Secondary | ICD-10-CM | POA: Diagnosis not present

## 2023-11-02 DIAGNOSIS — R278 Other lack of coordination: Secondary | ICD-10-CM | POA: Diagnosis not present

## 2023-11-02 DIAGNOSIS — M62512 Muscle wasting and atrophy, not elsewhere classified, left shoulder: Secondary | ICD-10-CM | POA: Diagnosis not present

## 2023-11-08 DIAGNOSIS — M62512 Muscle wasting and atrophy, not elsewhere classified, left shoulder: Secondary | ICD-10-CM | POA: Diagnosis not present

## 2023-11-08 DIAGNOSIS — R296 Repeated falls: Secondary | ICD-10-CM | POA: Diagnosis not present

## 2023-11-08 DIAGNOSIS — R278 Other lack of coordination: Secondary | ICD-10-CM | POA: Diagnosis not present

## 2023-11-09 DIAGNOSIS — S42202D Unspecified fracture of upper end of left humerus, subsequent encounter for fracture with routine healing: Secondary | ICD-10-CM | POA: Diagnosis not present

## 2023-11-10 DIAGNOSIS — M62512 Muscle wasting and atrophy, not elsewhere classified, left shoulder: Secondary | ICD-10-CM | POA: Diagnosis not present

## 2023-11-10 DIAGNOSIS — R278 Other lack of coordination: Secondary | ICD-10-CM | POA: Diagnosis not present

## 2023-11-10 DIAGNOSIS — R296 Repeated falls: Secondary | ICD-10-CM | POA: Diagnosis not present

## 2023-11-15 DIAGNOSIS — R296 Repeated falls: Secondary | ICD-10-CM | POA: Diagnosis not present

## 2023-11-15 DIAGNOSIS — M62512 Muscle wasting and atrophy, not elsewhere classified, left shoulder: Secondary | ICD-10-CM | POA: Diagnosis not present

## 2023-11-15 DIAGNOSIS — R278 Other lack of coordination: Secondary | ICD-10-CM | POA: Diagnosis not present

## 2023-11-17 DIAGNOSIS — R296 Repeated falls: Secondary | ICD-10-CM | POA: Diagnosis not present

## 2023-11-17 DIAGNOSIS — M62512 Muscle wasting and atrophy, not elsewhere classified, left shoulder: Secondary | ICD-10-CM | POA: Diagnosis not present

## 2023-11-17 DIAGNOSIS — R278 Other lack of coordination: Secondary | ICD-10-CM | POA: Diagnosis not present

## 2023-11-18 DIAGNOSIS — R2681 Unsteadiness on feet: Secondary | ICD-10-CM | POA: Diagnosis not present

## 2023-11-18 DIAGNOSIS — M6259 Muscle wasting and atrophy, not elsewhere classified, multiple sites: Secondary | ICD-10-CM | POA: Diagnosis not present

## 2023-11-18 DIAGNOSIS — R2689 Other abnormalities of gait and mobility: Secondary | ICD-10-CM | POA: Diagnosis not present

## 2023-11-18 DIAGNOSIS — R278 Other lack of coordination: Secondary | ICD-10-CM | POA: Diagnosis not present

## 2023-11-22 DIAGNOSIS — R296 Repeated falls: Secondary | ICD-10-CM | POA: Diagnosis not present

## 2023-11-22 DIAGNOSIS — M62512 Muscle wasting and atrophy, not elsewhere classified, left shoulder: Secondary | ICD-10-CM | POA: Diagnosis not present

## 2023-11-22 DIAGNOSIS — R278 Other lack of coordination: Secondary | ICD-10-CM | POA: Diagnosis not present

## 2023-11-24 DIAGNOSIS — K080 Exfoliation of teeth due to systemic causes: Secondary | ICD-10-CM | POA: Diagnosis not present

## 2023-11-24 DIAGNOSIS — R278 Other lack of coordination: Secondary | ICD-10-CM | POA: Diagnosis not present

## 2023-11-24 DIAGNOSIS — R296 Repeated falls: Secondary | ICD-10-CM | POA: Diagnosis not present

## 2023-11-24 DIAGNOSIS — M62512 Muscle wasting and atrophy, not elsewhere classified, left shoulder: Secondary | ICD-10-CM | POA: Diagnosis not present

## 2023-11-29 DIAGNOSIS — R296 Repeated falls: Secondary | ICD-10-CM | POA: Diagnosis not present

## 2023-11-29 DIAGNOSIS — R2681 Unsteadiness on feet: Secondary | ICD-10-CM | POA: Diagnosis not present

## 2023-11-29 DIAGNOSIS — R2689 Other abnormalities of gait and mobility: Secondary | ICD-10-CM | POA: Diagnosis not present

## 2023-11-29 DIAGNOSIS — M6259 Muscle wasting and atrophy, not elsewhere classified, multiple sites: Secondary | ICD-10-CM | POA: Diagnosis not present

## 2023-11-29 DIAGNOSIS — R278 Other lack of coordination: Secondary | ICD-10-CM | POA: Diagnosis not present

## 2023-11-29 DIAGNOSIS — M62512 Muscle wasting and atrophy, not elsewhere classified, left shoulder: Secondary | ICD-10-CM | POA: Diagnosis not present

## 2023-12-01 DIAGNOSIS — M62512 Muscle wasting and atrophy, not elsewhere classified, left shoulder: Secondary | ICD-10-CM | POA: Diagnosis not present

## 2023-12-01 DIAGNOSIS — R2689 Other abnormalities of gait and mobility: Secondary | ICD-10-CM | POA: Diagnosis not present

## 2023-12-01 DIAGNOSIS — R278 Other lack of coordination: Secondary | ICD-10-CM | POA: Diagnosis not present

## 2023-12-01 DIAGNOSIS — R2681 Unsteadiness on feet: Secondary | ICD-10-CM | POA: Diagnosis not present

## 2023-12-01 DIAGNOSIS — R296 Repeated falls: Secondary | ICD-10-CM | POA: Diagnosis not present

## 2023-12-01 DIAGNOSIS — M6259 Muscle wasting and atrophy, not elsewhere classified, multiple sites: Secondary | ICD-10-CM | POA: Diagnosis not present

## 2023-12-06 DIAGNOSIS — M62512 Muscle wasting and atrophy, not elsewhere classified, left shoulder: Secondary | ICD-10-CM | POA: Diagnosis not present

## 2023-12-06 DIAGNOSIS — R2689 Other abnormalities of gait and mobility: Secondary | ICD-10-CM | POA: Diagnosis not present

## 2023-12-06 DIAGNOSIS — R2681 Unsteadiness on feet: Secondary | ICD-10-CM | POA: Diagnosis not present

## 2023-12-06 DIAGNOSIS — M6259 Muscle wasting and atrophy, not elsewhere classified, multiple sites: Secondary | ICD-10-CM | POA: Diagnosis not present

## 2023-12-06 DIAGNOSIS — R278 Other lack of coordination: Secondary | ICD-10-CM | POA: Diagnosis not present

## 2023-12-06 DIAGNOSIS — R296 Repeated falls: Secondary | ICD-10-CM | POA: Diagnosis not present

## 2023-12-08 DIAGNOSIS — R2689 Other abnormalities of gait and mobility: Secondary | ICD-10-CM | POA: Diagnosis not present

## 2023-12-08 DIAGNOSIS — R2681 Unsteadiness on feet: Secondary | ICD-10-CM | POA: Diagnosis not present

## 2023-12-08 DIAGNOSIS — R278 Other lack of coordination: Secondary | ICD-10-CM | POA: Diagnosis not present

## 2023-12-08 DIAGNOSIS — R296 Repeated falls: Secondary | ICD-10-CM | POA: Diagnosis not present

## 2023-12-08 DIAGNOSIS — M62512 Muscle wasting and atrophy, not elsewhere classified, left shoulder: Secondary | ICD-10-CM | POA: Diagnosis not present

## 2023-12-08 DIAGNOSIS — M6259 Muscle wasting and atrophy, not elsewhere classified, multiple sites: Secondary | ICD-10-CM | POA: Diagnosis not present

## 2023-12-13 DIAGNOSIS — R296 Repeated falls: Secondary | ICD-10-CM | POA: Diagnosis not present

## 2023-12-13 DIAGNOSIS — R278 Other lack of coordination: Secondary | ICD-10-CM | POA: Diagnosis not present

## 2023-12-13 DIAGNOSIS — M6259 Muscle wasting and atrophy, not elsewhere classified, multiple sites: Secondary | ICD-10-CM | POA: Diagnosis not present

## 2023-12-13 DIAGNOSIS — R2681 Unsteadiness on feet: Secondary | ICD-10-CM | POA: Diagnosis not present

## 2023-12-13 DIAGNOSIS — R2689 Other abnormalities of gait and mobility: Secondary | ICD-10-CM | POA: Diagnosis not present

## 2023-12-13 DIAGNOSIS — M62512 Muscle wasting and atrophy, not elsewhere classified, left shoulder: Secondary | ICD-10-CM | POA: Diagnosis not present

## 2023-12-14 ENCOUNTER — Emergency Department (HOSPITAL_BASED_OUTPATIENT_CLINIC_OR_DEPARTMENT_OTHER)
Admission: EM | Admit: 2023-12-14 | Discharge: 2023-12-14 | Disposition: A | Discharge status: Home / Self Care | Attending: Emergency Medicine | Admitting: Emergency Medicine

## 2023-12-14 ENCOUNTER — Other Ambulatory Visit (HOSPITAL_BASED_OUTPATIENT_CLINIC_OR_DEPARTMENT_OTHER): Payer: Self-pay

## 2023-12-14 ENCOUNTER — Other Ambulatory Visit: Payer: Self-pay

## 2023-12-14 DIAGNOSIS — R42 Dizziness and giddiness: Secondary | ICD-10-CM | POA: Diagnosis not present

## 2023-12-14 LAB — URINALYSIS, W/ REFLEX TO CULTURE (INFECTION SUSPECTED)
Bacteria, UA: NONE SEEN
Bilirubin Urine: NEGATIVE
Glucose, UA: NEGATIVE mg/dL
Hgb urine dipstick: NEGATIVE
Ketones, ur: NEGATIVE mg/dL
Nitrite: NEGATIVE
Protein, ur: NEGATIVE mg/dL
Specific Gravity, Urine: 1.013 (ref 1.005–1.030)
pH: 6 (ref 5.0–8.0)

## 2023-12-14 LAB — BASIC METABOLIC PANEL WITH GFR
Anion gap: 10 (ref 5–15)
BUN: 11 mg/dL (ref 8–23)
CO2: 25 mmol/L (ref 22–32)
Calcium: 9.6 mg/dL (ref 8.9–10.3)
Chloride: 108 mmol/L (ref 98–111)
Creatinine, Ser: 0.67 mg/dL (ref 0.44–1.00)
GFR, Estimated: >60 mL/min (ref >60)
Glucose, Bld: 89 mg/dL (ref 70–99)
Potassium: 4.2 mmol/L (ref 3.5–5.1)
Sodium: 143 mmol/L (ref 135–145)

## 2023-12-14 LAB — CBC
HCT: 42.4 % (ref 36.0–46.0)
Hemoglobin: 14 g/dL (ref 12.0–15.0)
MCH: 31.5 pg (ref 26.0–34.0)
MCHC: 33 g/dL (ref 30.0–36.0)
MCV: 95.5 fL (ref 80.0–100.0)
Platelets: 195 10*3/uL (ref 150–400)
RBC: 4.44 MIL/uL (ref 3.87–5.11)
RDW: 12.2 % (ref 11.5–15.5)
WBC: 5.6 10*3/uL (ref 4.0–10.5)
nRBC: 0 % (ref 0.0–0.2)

## 2023-12-14 LAB — CBG MONITORING, ED: Glucose-Capillary: 97 mg/dL (ref 70–99)

## 2023-12-14 MED ORDER — SODIUM CHLORIDE 0.9 % IV BOLUS
500.0000 mL | Freq: Once | INTRAVENOUS | Status: AC
  Administered 2023-12-14: 500 mL via INTRAVENOUS

## 2023-12-14 MED ORDER — MECLIZINE HCL 25 MG PO TABS
25.0000 mg | ORAL_TABLET | Freq: Once | ORAL | Status: DC
  Filled 2023-12-14: qty 1

## 2023-12-14 MED ORDER — MECLIZINE HCL 12.5 MG PO TABS
12.5000 mg | ORAL_TABLET | Freq: Three times a day (TID) | ORAL | 0 refills | Status: AC | PRN
  Filled 2023-12-14: qty 15, 5d supply, fill #0

## 2023-12-14 NOTE — ED Provider Notes (Signed)
 [place] [facility]PART[facility] AT [facility] Provider Note   CSN: [id number] Arrival date & time: 12/14/23  1145     Patient presents with: Dizziness  HPI Tammy Kelly is a 83 y.o. female with history of anxiety, depression and high cholesterol presenting for dizziness.  She states this dizziness has been going on for several months but was somewhat worse in the last 3 nights.  She states the dizziness only occurs when she is lying flat and resolves when she is sitting or walking.  She was working with her physical therapist yesterday she reports and she informed the therapist that she was feeling dizzy which prompted evaluation here today.  She denies any chest pain, shortness of breath or palpitations.  Denies headache, visual disturbance, weakness or numbness in her extremities or issues with speech.  Patient is ambulatory with her walker which is what she normally uses.  Denies abdominal pain, nausea vomiting diarrhea.  Denies urinary symptoms.    Dizziness      Prior to Admission medications   Medication Sig Start Date End Date Taking? Authorizing Provider  meclizine  (ANTIVERT ) 12.5 MG tablet Take 1 tablet (12.5 mg total) by mouth 3 (three) times daily as needed for dizziness. 12/14/23  Yes Elizardo Chilson K, PA-C  atorvastatin (LIPITOR) 10 MG tablet Take 10 mg by mouth daily.    [provider]  azithromycin  (ZITHROMAX ) 250 MG tablet Take first 2 tablets together, then 1 every day until finished. 06/23/23   Mannie Pac T, DO  benzonatate  (TESSALON ) 100 MG capsule Take 1 capsule (100 mg total) by mouth 3 (three) times daily as needed for cough. 06/17/23   Banister, Pamela K, MD  calcium carbonate (OSCAL) 1500 (600 Ca) MG TABS tablet Take by mouth 2 (two) times daily with a meal.    [provider]  mirtazapine (REMERON) 15 MG tablet Take 15 mg by mouth at bedtime.    [provider]  Multiple Vitamins-Minerals (PRESERVISION AREDS PO) Take by  mouth.    [provider]  oxyCODONE  (ROXICODONE ) 5 MG immediate release tablet Take 1 tablet (5 mg total) by mouth every 4 (four) hours as needed for severe pain (pain score 7-10). 08/14/23   Hildegard Loge, PA-C  traZODone (DESYREL) 50 MG tablet Take 50 mg by mouth at bedtime as needed. 06/09/22 06/08/24  [provider]  triamcinolone ointment (KENALOG) 0.1 % Apply 1 Application topically. 08/06/22 08/06/24  [provider]    Allergies: Patient has no known allergies.    Review of Systems  Neurological:  Positive for dizziness.    Updated Vital Signs BP (!) 161/72   Pulse (!) 59   Temp 98.2 F (36.8 C) (Oral)   Resp 18   SpO2 98%   Physical Exam Vitals and nursing note reviewed.  HENT:     Head: Normocephalic and atraumatic.     Mouth/Throat:     Mouth: Mucous membranes are moist.   Eyes:     General:        Right eye: No discharge.        Left eye: No discharge.     Conjunctiva/sclera: Conjunctivae normal.    Cardiovascular:     Rate and Rhythm: Normal rate and regular rhythm.     Pulses: Normal pulses.     Heart sounds: Normal heart sounds.  Pulmonary:     Effort: Pulmonary effort is normal.     Breath sounds: Normal breath sounds.  Abdominal:  General: Abdomen is flat.     Palpations: Abdomen is soft.   Skin:    General: Skin is warm and dry.   Neurological:     General: No focal deficit present.     Comments: GCS 15. Speech is goal oriented. No deficits appreciated to CN III-XII; symmetric eyebrow raise, no facial drooping, tongue midline. Patient has equal grip strength bilaterally with 5/5 strength against resistance in all major muscle groups bilaterally. Sensation to light touch intact. Patient moves extremities without ataxia. Normal finger-nose-finger. Patient ambulatory with steady gait with a walker.  Psychiatric:        Mood and Affect: Mood normal.     (all labs ordered are listed, but only abnormal results are  displayed) Labs Reviewed  URINALYSIS, W/ REFLEX TO CULTURE (INFECTION SUSPECTED) - Abnormal; Notable for the following components:      Result Value   Leukocytes,Ua SMALL (*)    All other components within normal limits  CBC  BASIC METABOLIC PANEL WITH GFR  CBG MONITORING, ED    EKG: EKG Interpretation Date/Time:  Wednesday December 14 2023 14:35:46 EDT Ventricular Rate:  57 PR Interval:  247 QRS Duration:  106 QT Interval:  407 QTC Calculation: 397 R Axis:   -53  Text Interpretation: Sinus rhythm Prolonged PR interval L Confirmed by Ruthe Cornet [phone]) on 12/14/2023 4:22:13 PM  Radiology: No results found.   Procedures   Medications Ordered in the ED  meclizine  (ANTIVERT ) tablet 25 mg (25 mg Oral Patient Refused/Not Given 12/14/23 1536)  sodium chloride  0.9 % bolus 500 mL (500 mLs Intravenous New Bag/Given 12/14/23 1549)                                    Medical Decision Making Amount and/or Complexity of Data Reviewed Labs: ordered.   Initial Impression and Ddx 83 year old well-appearing female presenting for dizziness. Exam is unremarkable. Ddx stroke, arrhythmia, ACS, electrolyte derangement, UTI, sepsis, other. Patient PMH that increases complexity of ED encounter:  history of anxiety, depression and high cholesterol  Interpretation of Diagnostics - I independent reviewed and interpreted the labs as followed: Small amount of leukocytes in the urine  - I personally reviewed interpret EKG which revealed sinus rhythm  Patient Reassessment and Ultimate Disposition/Management On reassessment, patient remained without dizziness or lightheadedness. She has been asymptomatic throughout the encounter. Workup was reassuring. Considered stroke but unlikely given the positional nature of her dizziness, it has been going on for 6 months and neuro exam was normal.  Patient refused meclizine .  Did send meclizine  to her pharmacy to use as needed.  Discussed return precautions.   Advised to follow-up with her PCP.  Discharged good condition.  Patient management required discussion with the following services or consulting groups:  None  Complexity of Problems Addressed Acute complicated illness or Injury  Additional Data Reviewed and Analyzed Further history obtained from: Past medical history and medications listed in the EMR and Prior ED visit notes  Patient Encounter Risk Assessment Prescriptions      Final diagnoses:  Dizziness    ED Discharge Orders          Ordered    meclizine  (ANTIVERT ) 12.5 MG tablet  3 times daily PRN        12/14/23 1656               Mahala Rommel K, PA-C 12/14/23 1656    Tegeler,  Lonni PARAS, MD 12/17/23 [phone]

## 2023-12-14 NOTE — Discharge Instructions (Addendum)
 Evaluation here today was reassuring.  Please follow-up with your PCP for your dizziness.  If you develop persistent dizziness, chest pain, palpitations, weakness or numbness in your extremities or any other concerning symptom please return to the ED for further evaluation.

## 2023-12-14 NOTE — ED Triage Notes (Signed)
 Patient reports dizziness for the past 3 nights only when lying down. Denies lightheadedness or dizziness when standing or walking.

## 2023-12-15 DIAGNOSIS — R296 Repeated falls: Secondary | ICD-10-CM | POA: Diagnosis not present

## 2023-12-15 DIAGNOSIS — M62512 Muscle wasting and atrophy, not elsewhere classified, left shoulder: Secondary | ICD-10-CM | POA: Diagnosis not present

## 2023-12-15 DIAGNOSIS — R278 Other lack of coordination: Secondary | ICD-10-CM | POA: Diagnosis not present

## 2023-12-19 DIAGNOSIS — R278 Other lack of coordination: Secondary | ICD-10-CM | POA: Diagnosis not present

## 2023-12-19 DIAGNOSIS — M6259 Muscle wasting and atrophy, not elsewhere classified, multiple sites: Secondary | ICD-10-CM | POA: Diagnosis not present

## 2023-12-19 DIAGNOSIS — R2689 Other abnormalities of gait and mobility: Secondary | ICD-10-CM | POA: Diagnosis not present

## 2023-12-19 DIAGNOSIS — R2681 Unsteadiness on feet: Secondary | ICD-10-CM | POA: Diagnosis not present

## 2023-12-19 DIAGNOSIS — S42202D Unspecified fracture of upper end of left humerus, subsequent encounter for fracture with routine healing: Secondary | ICD-10-CM | POA: Diagnosis not present

## 2023-12-20 DIAGNOSIS — R278 Other lack of coordination: Secondary | ICD-10-CM | POA: Diagnosis not present

## 2023-12-20 DIAGNOSIS — R296 Repeated falls: Secondary | ICD-10-CM | POA: Diagnosis not present

## 2023-12-20 DIAGNOSIS — M62512 Muscle wasting and atrophy, not elsewhere classified, left shoulder: Secondary | ICD-10-CM | POA: Diagnosis not present

## 2023-12-22 DIAGNOSIS — R296 Repeated falls: Secondary | ICD-10-CM | POA: Diagnosis not present

## 2023-12-22 DIAGNOSIS — R278 Other lack of coordination: Secondary | ICD-10-CM | POA: Diagnosis not present

## 2023-12-22 DIAGNOSIS — M62512 Muscle wasting and atrophy, not elsewhere classified, left shoulder: Secondary | ICD-10-CM | POA: Diagnosis not present

## 2023-12-26 DIAGNOSIS — M62512 Muscle wasting and atrophy, not elsewhere classified, left shoulder: Secondary | ICD-10-CM | POA: Diagnosis not present

## 2023-12-26 DIAGNOSIS — R296 Repeated falls: Secondary | ICD-10-CM | POA: Diagnosis not present

## 2023-12-26 DIAGNOSIS — R278 Other lack of coordination: Secondary | ICD-10-CM | POA: Diagnosis not present

## 2024-03-19 DIAGNOSIS — F3340 Major depressive disorder, recurrent, in remission, unspecified: Secondary | ICD-10-CM | POA: Diagnosis not present

## 2024-03-19 DIAGNOSIS — M85859 Other specified disorders of bone density and structure, unspecified thigh: Secondary | ICD-10-CM | POA: Diagnosis not present

## 2024-03-19 DIAGNOSIS — M5440 Lumbago with sciatica, unspecified side: Secondary | ICD-10-CM | POA: Diagnosis not present

## 2024-03-29 DIAGNOSIS — K080 Exfoliation of teeth due to systemic causes: Secondary | ICD-10-CM | POA: Diagnosis not present

## 2024-04-12 DIAGNOSIS — M5442 Lumbago with sciatica, left side: Secondary | ICD-10-CM | POA: Diagnosis not present

## 2024-04-12 DIAGNOSIS — R2689 Other abnormalities of gait and mobility: Secondary | ICD-10-CM | POA: Diagnosis not present

## 2024-04-17 DIAGNOSIS — R2689 Other abnormalities of gait and mobility: Secondary | ICD-10-CM | POA: Diagnosis not present

## 2024-04-17 DIAGNOSIS — M5442 Lumbago with sciatica, left side: Secondary | ICD-10-CM | POA: Diagnosis not present

## 2024-04-19 DIAGNOSIS — R2689 Other abnormalities of gait and mobility: Secondary | ICD-10-CM | POA: Diagnosis not present

## 2024-04-19 DIAGNOSIS — M5442 Lumbago with sciatica, left side: Secondary | ICD-10-CM | POA: Diagnosis not present

## 2024-04-24 DIAGNOSIS — M5442 Lumbago with sciatica, left side: Secondary | ICD-10-CM | POA: Diagnosis not present

## 2024-04-24 DIAGNOSIS — R2689 Other abnormalities of gait and mobility: Secondary | ICD-10-CM | POA: Diagnosis not present

## 2024-04-26 DIAGNOSIS — M5442 Lumbago with sciatica, left side: Secondary | ICD-10-CM | POA: Diagnosis not present

## 2024-04-26 DIAGNOSIS — R2689 Other abnormalities of gait and mobility: Secondary | ICD-10-CM | POA: Diagnosis not present

## 2024-05-01 DIAGNOSIS — M5442 Lumbago with sciatica, left side: Secondary | ICD-10-CM | POA: Diagnosis not present

## 2024-05-01 DIAGNOSIS — R2689 Other abnormalities of gait and mobility: Secondary | ICD-10-CM | POA: Diagnosis not present

## 2024-05-03 DIAGNOSIS — M5442 Lumbago with sciatica, left side: Secondary | ICD-10-CM | POA: Diagnosis not present

## 2024-05-03 DIAGNOSIS — R2689 Other abnormalities of gait and mobility: Secondary | ICD-10-CM | POA: Diagnosis not present

## 2024-05-07 DIAGNOSIS — Z1331 Encounter for screening for depression: Secondary | ICD-10-CM | POA: Diagnosis not present

## 2024-05-07 DIAGNOSIS — E785 Hyperlipidemia, unspecified: Secondary | ICD-10-CM | POA: Diagnosis not present

## 2024-05-07 DIAGNOSIS — Z23 Encounter for immunization: Secondary | ICD-10-CM | POA: Diagnosis not present

## 2024-05-07 DIAGNOSIS — M5440 Lumbago with sciatica, unspecified side: Secondary | ICD-10-CM | POA: Diagnosis not present

## 2024-05-07 DIAGNOSIS — F3340 Major depressive disorder, recurrent, in remission, unspecified: Secondary | ICD-10-CM | POA: Diagnosis not present

## 2024-05-07 DIAGNOSIS — M85859 Other specified disorders of bone density and structure, unspecified thigh: Secondary | ICD-10-CM | POA: Diagnosis not present

## 2024-05-07 DIAGNOSIS — Z0000 Encounter for general adult medical examination without abnormal findings: Secondary | ICD-10-CM | POA: Diagnosis not present

## 2024-05-08 DIAGNOSIS — R2689 Other abnormalities of gait and mobility: Secondary | ICD-10-CM | POA: Diagnosis not present

## 2024-05-08 DIAGNOSIS — M5442 Lumbago with sciatica, left side: Secondary | ICD-10-CM | POA: Diagnosis not present

## 2024-05-10 DIAGNOSIS — R2689 Other abnormalities of gait and mobility: Secondary | ICD-10-CM | POA: Diagnosis not present

## 2024-05-10 DIAGNOSIS — M5442 Lumbago with sciatica, left side: Secondary | ICD-10-CM | POA: Diagnosis not present

## 2024-05-14 DIAGNOSIS — M5442 Lumbago with sciatica, left side: Secondary | ICD-10-CM | POA: Diagnosis not present

## 2024-05-14 DIAGNOSIS — R2689 Other abnormalities of gait and mobility: Secondary | ICD-10-CM | POA: Diagnosis not present

## 2024-05-16 DIAGNOSIS — R2689 Other abnormalities of gait and mobility: Secondary | ICD-10-CM | POA: Diagnosis not present

## 2024-05-16 DIAGNOSIS — M5442 Lumbago with sciatica, left side: Secondary | ICD-10-CM | POA: Diagnosis not present

## 2024-06-09 DIAGNOSIS — J029 Acute pharyngitis, unspecified: Secondary | ICD-10-CM | POA: Diagnosis not present

## 2024-06-09 DIAGNOSIS — J101 Influenza due to other identified influenza virus with other respiratory manifestations: Secondary | ICD-10-CM | POA: Diagnosis not present

## 2024-06-09 DIAGNOSIS — R051 Acute cough: Secondary | ICD-10-CM | POA: Diagnosis not present

## 2024-06-09 DIAGNOSIS — R0981 Nasal congestion: Secondary | ICD-10-CM | POA: Diagnosis not present
